# Patient Record
Sex: Female | Born: 1970 | Race: Asian | Hispanic: No | Marital: Married | Smoking: Former smoker
Health system: Southern US, Community
[De-identification: ages and names within clinical notes are randomized; demographics above are authoritative.]

## PROBLEM LIST (undated history)

## (undated) DIAGNOSIS — Z9989 Dependence on other enabling machines and devices: Secondary | ICD-10-CM

## (undated) DIAGNOSIS — G4733 Obstructive sleep apnea (adult) (pediatric): Secondary | ICD-10-CM

## (undated) DIAGNOSIS — F329 Major depressive disorder, single episode, unspecified: Secondary | ICD-10-CM

## (undated) DIAGNOSIS — D219 Benign neoplasm of connective and other soft tissue, unspecified: Secondary | ICD-10-CM

## (undated) DIAGNOSIS — Z8669 Personal history of other diseases of the nervous system and sense organs: Secondary | ICD-10-CM

## (undated) DIAGNOSIS — R350 Frequency of micturition: Secondary | ICD-10-CM

## (undated) DIAGNOSIS — E119 Type 2 diabetes mellitus without complications: Secondary | ICD-10-CM

## (undated) DIAGNOSIS — M1990 Unspecified osteoarthritis, unspecified site: Secondary | ICD-10-CM

## (undated) DIAGNOSIS — Z87898 Personal history of other specified conditions: Secondary | ICD-10-CM

## (undated) DIAGNOSIS — F32A Depression, unspecified: Secondary | ICD-10-CM

## (undated) DIAGNOSIS — Z8619 Personal history of other infectious and parasitic diseases: Secondary | ICD-10-CM

## (undated) DIAGNOSIS — I10 Essential (primary) hypertension: Secondary | ICD-10-CM

## (undated) DIAGNOSIS — Z973 Presence of spectacles and contact lenses: Secondary | ICD-10-CM

## (undated) DIAGNOSIS — F419 Anxiety disorder, unspecified: Secondary | ICD-10-CM

## (undated) DIAGNOSIS — E039 Hypothyroidism, unspecified: Secondary | ICD-10-CM

## (undated) HISTORY — DX: Personal history of other infectious and parasitic diseases: Z86.19

## (undated) HISTORY — DX: Major depressive disorder, single episode, unspecified: F32.9

## (undated) HISTORY — DX: Personal history of other specified conditions: Z87.898

## (undated) HISTORY — DX: Personal history of other diseases of the nervous system and sense organs: Z86.69

## (undated) HISTORY — DX: Dependence on other enabling machines and devices: Z99.89

## (undated) HISTORY — DX: Essential (primary) hypertension: I10

## (undated) HISTORY — DX: Benign neoplasm of connective and other soft tissue, unspecified: D21.9

## (undated) HISTORY — DX: Depression, unspecified: F32.A

## (undated) HISTORY — DX: Obstructive sleep apnea (adult) (pediatric): G47.33

---

## 1992-02-07 HISTORY — PX: LEEP: SHX91

## 2003-02-07 DIAGNOSIS — Z9289 Personal history of other medical treatment: Secondary | ICD-10-CM

## 2003-02-07 HISTORY — DX: Personal history of other medical treatment: Z92.89

## 2003-02-07 HISTORY — PX: DILATION AND CURETTAGE OF UTERUS: SHX78

## 2004-04-06 HISTORY — PX: MYOMECTOMY: SHX85

## 2008-05-12 ENCOUNTER — Emergency Department (HOSPITAL_BASED_OUTPATIENT_CLINIC_OR_DEPARTMENT_OTHER): Admission: EM | Admit: 2008-05-12 | Discharge: 2008-05-12 | Payer: Self-pay | Admitting: Emergency Medicine

## 2008-08-29 ENCOUNTER — Ambulatory Visit: Payer: Self-pay | Admitting: Diagnostic Radiology

## 2008-08-29 ENCOUNTER — Emergency Department (HOSPITAL_BASED_OUTPATIENT_CLINIC_OR_DEPARTMENT_OTHER): Admission: EM | Admit: 2008-08-29 | Discharge: 2008-08-29 | Payer: Self-pay | Admitting: Emergency Medicine

## 2008-09-24 HISTORY — PX: TRANSTHORACIC ECHOCARDIOGRAM: SHX275

## 2008-09-24 HISTORY — PX: OTHER SURGICAL HISTORY: SHX169

## 2010-05-15 LAB — URINALYSIS, ROUTINE W REFLEX MICROSCOPIC
Glucose, UA: 100 mg/dL — AB
Specific Gravity, Urine: 1.008 (ref 1.005–1.030)
Urobilinogen, UA: 0.2 mg/dL (ref 0.0–1.0)
pH: 6 (ref 5.0–8.0)

## 2010-05-15 LAB — BASIC METABOLIC PANEL
CO2: 23 mEq/L (ref 19–32)
Chloride: 100 mEq/L (ref 96–112)
GFR calc non Af Amer: >60 mL/min (ref >60)
Glucose, Bld: 193 mg/dL — ABNORMAL HIGH (ref 70–99)
Potassium: 3.7 mEq/L (ref 3.5–5.1)
Sodium: 138 mEq/L (ref 135–145)

## 2010-05-15 LAB — POCT CARDIAC MARKERS
Troponin i, poc: <0.05 ng/mL (ref 0.00–0.09)
Troponin i, poc: <0.05 ng/mL (ref 0.00–0.09)

## 2010-05-15 LAB — URINE MICROSCOPIC-ADD ON

## 2010-05-24 ENCOUNTER — Ambulatory Visit
Admission: RE | Admit: 2010-05-24 | Discharge: 2010-05-24 | Disposition: A | Discharge status: Home / Self Care | Payer: BLUE CROSS/BLUE SHIELD | Source: Ambulatory Visit | Attending: Sports Medicine | Admitting: Sports Medicine

## 2010-05-24 ENCOUNTER — Other Ambulatory Visit: Payer: Self-pay | Admitting: Sports Medicine

## 2010-05-24 DIAGNOSIS — S60222A Contusion of left hand, initial encounter: Secondary | ICD-10-CM

## 2010-07-09 IMAGING — CT CT HEAD W/O CM
1 series · 1 of 2 positions shown · non-contrast
Comparison: None

CLINICAL DATA: Posterior headache.  Hypertension and visual
problems.

CT HEAD WITHOUT CONTRAST
TECHNIQUE: Contiguous axial images were obtained from the base of
the skull through the vertex without contrast.

[Series 1: topogram 0.6 t20s · sagittal · 0.6mm · 1.00mm/px · 1 of 2 slices shown]
[im 2/2]
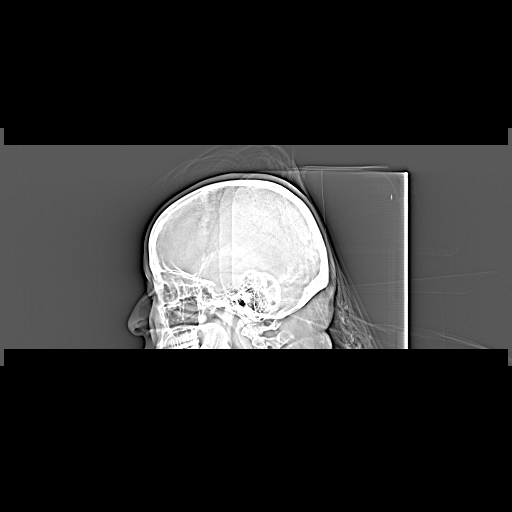

[1 of 2 positions shown; findings below may reference images not displayed]

FINDINGS: There is no evidence of acute intracranial hemorrhage,
mass lesion, brain edema or extra-axial fluid collection.  The
ventricles and subarachnoid spaces are appropriately sized for age.
There is no CT evidence of acute cortical infarction.

The visualized paranasal sinuses are clear.  The calvarium is
intact.
IMPRESSION: No acute intracranial findings.

## 2011-05-17 ENCOUNTER — Ambulatory Visit (INDEPENDENT_AMBULATORY_CARE_PROVIDER_SITE_OTHER): Payer: BC Managed Care – PPO | Admitting: Obstetrics and Gynecology

## 2011-05-17 ENCOUNTER — Encounter: Payer: Self-pay | Admitting: Obstetrics and Gynecology

## 2011-05-17 VITALS — BP 120/82 | Ht <= 58 in | Wt 163.0 lb

## 2011-05-17 DIAGNOSIS — Z124 Encounter for screening for malignant neoplasm of cervix: Secondary | ICD-10-CM

## 2011-05-17 DIAGNOSIS — D219 Benign neoplasm of connective and other soft tissue, unspecified: Secondary | ICD-10-CM | POA: Insufficient documentation

## 2011-05-17 DIAGNOSIS — D259 Leiomyoma of uterus, unspecified: Secondary | ICD-10-CM

## 2011-05-17 DIAGNOSIS — Z01419 Encounter for gynecological examination (general) (routine) without abnormal findings: Secondary | ICD-10-CM

## 2011-05-17 MED ORDER — VALACYCLOVIR HCL 500 MG PO TABS: 500.0000 mg | ORAL_TABLET | ORAL | Status: DC

## 2011-05-17 NOTE — Progress Notes (Signed)
Subjective:    Hannah Yang is a 41 y.o. female who presents for an annual exam. The patient reports:no complaints.S/P Robotic myomectomy and LEEP.   The patient is sexually active.no method Is OK with pregnancy.  Last pap: was normal  April  2012  GC/Chlamydia cultures offered: declined HIV/RPR/HbsAg offered:  declined HSV 1 and 2 glycoprotein offered: declined  Menstrual cycle:   LMP: Patient's last menstrual period was 05/02/2011.           Cycle is monthly with normal flow and without intermenstrual bleeding or severe dysmenorrhea  Menstrual History: OB History    Grav Para Term Preterm Abortions TAB SAB Ect Mult Living   0              Patient's last menstrual period was 05/02/2011.    The following portions of the patient's history were reviewed and updated as appropriate: allergies, current medications, past family history, past medical history, past social history, past surgical history and problem list.  Review of Systems Pertinent items are noted in HPI. Breast:Negative for breast lump,nipple discharge or nipple retraction Gastrointestinal: Negative for abdominal pain, change in bowel habits or rectal bleeding Urinary:negative    Objective:    BP 120/82  Ht 4\' 10"  (1.473 m)  Wt 163 lb (73.936 kg)  BMI 34.07 kg/m2  LMP 05/02/2011 Weight:  Wt Readings from Last 1 Encounters:  05/17/11 163 lb (73.936 kg)   BMI: Body mass index is 34.07 kg/(m^2). General Appearance: Alert, appropriate appearance for age. No acute distress HEENT: Grossly normal Neck / Thyroid: Supple, no masses, nodes or enlargement Lungs: clear to auscultation bilaterally Back: No CVA tenderness Breast Exam: No masses or nodes.No dimpling, nipple retraction or discharge. Cardiovascular: Regular rate and rhythm. S1, S2, no murmur Gastrointestinal: Soft, non-tender, no masses or organomegaly Pelvic Exam: Vulva and vagina appear normal. Bimanual exam reveals normal uterus and  adnexa. Rectovaginal: not indicated Lymphatic Exam: Non-palpable nodes in neck, clavicular, axillary, or inguinal regions Skin: no rash or abnormalities Neurologic: Normal gait and speech, no tremor  Psychiatric: Alert and oriented, appropriate affect.   Wet Prep:not applicable Urinalysis:not applicable UPT: Not done   Assessment:    Normal gyn exam    Plan:    pap smear return annually or prn Valtrex daily STD screening: done, declined Contraception:no method       Kaelin Holford A MD

## 2011-05-18 LAB — PAP IG W/ RFLX HPV ASCU

## 2012-04-02 IMAGING — CT CT 3D INDEPENDENT WKST
2 of 4 series · 5 of 14 positions shown, 6 images · non-contrast
Comparison: None

CLINICAL DATA: Left hand injury.

CT LEFT HAND WITHOUT CONTRAST
TECHNIQUE: Multidetector CT imaging of the left hand was performed
according to the standard protocol without intravenous contrast.
Multiplanar CT image reconstructions were also generated.
TECHNIQUE: 3-dimensional CT images were rendered by post-
processing of the original CT data at independent workstation.  The
3-dimensional CT images were interpreted, and findings were
reported in the accompanying complete CT report for this study.

[Series 3: left hand/wrist/bone · axial · 0.29mm/px · z∈[+105,+215]mm · 3 of 88 slices shown, 4 images]
[im 22/88  soft-tissue]
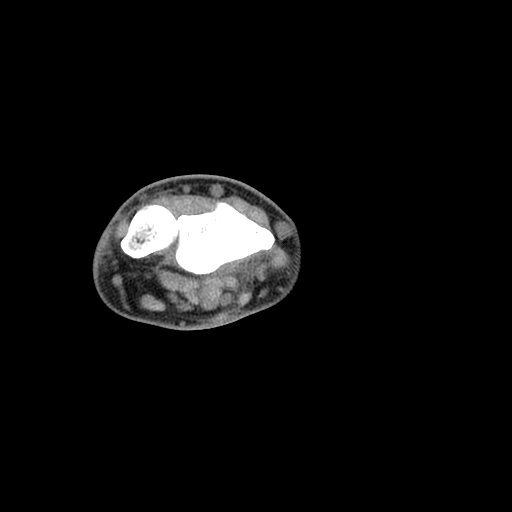
[im 22/88  bone]
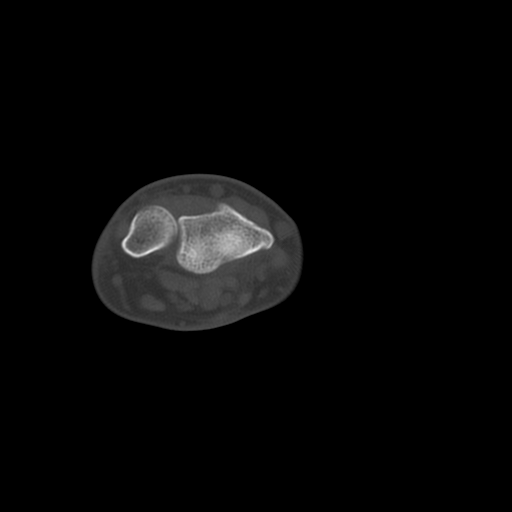
[im 44/88  bone]
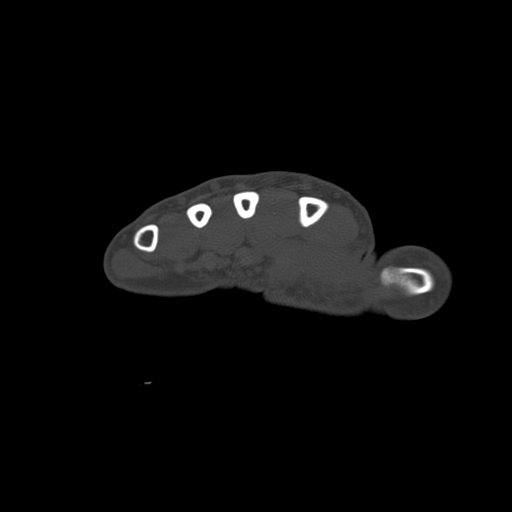
[im 66/88  bone]
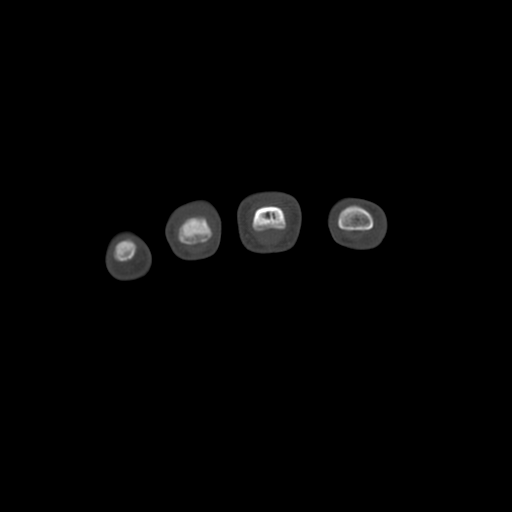

[Series 4: left hand/wrist/soft · axial · 0.29mm/px · z∈[+125,+198]mm · 2 of 88 slices shown]
[im 30/88  soft-tissue]
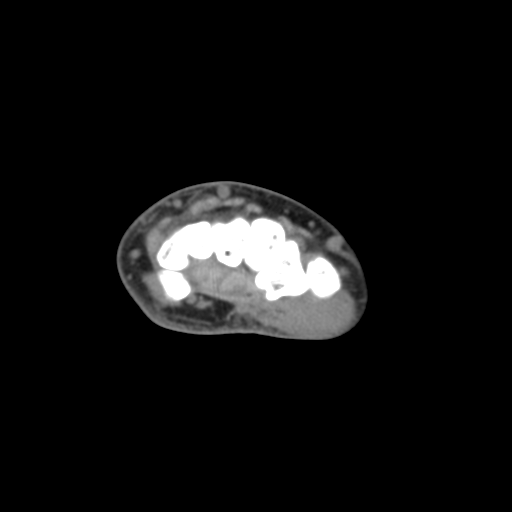
[im 59/88  soft-tissue]
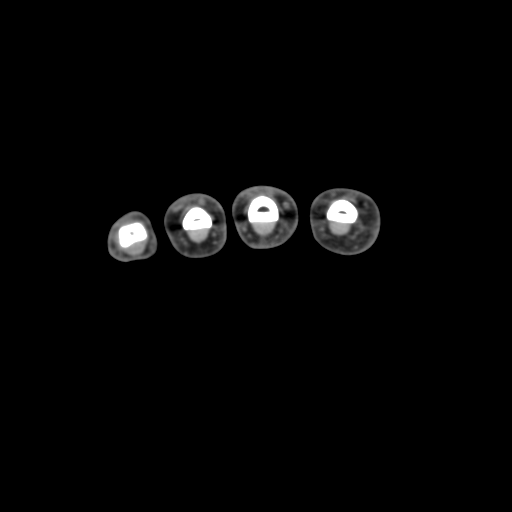

[5 of 14 positions shown; findings below may reference images not displayed]

FINDINGS: The joint spaces are maintained.  No fractures are
identified.  The radiocarpal, radioulnar and intercarpal joint
spaces are normal.  No erosions or fractures involving the wrist.
There is a rounded well corticated bony density along the radial
margin of the proximal phalanx of the middle finger which could be
an old avulsion fracture or a secondary unfused ossification
center.
IMPRESSION: No acute bony findings.

3-DIMENSIONAL CT IMAGE RENDERING AT INDEPENDENT WORKSTATION:

## 2012-07-08 ENCOUNTER — Other Ambulatory Visit: Payer: Self-pay | Admitting: *Deleted

## 2012-07-08 MED ORDER — ARMODAFINIL 150 MG PO TABS: 150.0000 mg | ORAL_TABLET | Freq: Every day | ORAL | Status: DC

## 2012-07-11 ENCOUNTER — Other Ambulatory Visit: Payer: Self-pay | Admitting: *Deleted

## 2012-07-11 MED ORDER — ARMODAFINIL 150 MG PO TABS: 150.0000 mg | ORAL_TABLET | Freq: Every day | ORAL | Status: DC

## 2012-07-17 ENCOUNTER — Telehealth: Payer: Self-pay | Admitting: *Deleted

## 2012-07-17 NOTE — Telephone Encounter (Signed)
FAXED NUVIGIL RX TO [facility].

## 2012-08-30 ENCOUNTER — Ambulatory Visit (INDEPENDENT_AMBULATORY_CARE_PROVIDER_SITE_OTHER): Payer: BC Managed Care – PPO | Admitting: Cardiovascular Disease

## 2012-08-30 ENCOUNTER — Encounter: Payer: Self-pay | Admitting: Cardiovascular Disease

## 2012-08-30 VITALS — BP 120/88 | HR 64 | Ht <= 58 in | Wt 157.1 lb

## 2012-08-30 DIAGNOSIS — R404 Transient alteration of awareness: Secondary | ICD-10-CM

## 2012-08-30 DIAGNOSIS — G4733 Obstructive sleep apnea (adult) (pediatric): Secondary | ICD-10-CM

## 2012-08-30 DIAGNOSIS — R400 Somnolence: Secondary | ICD-10-CM

## 2012-08-30 DIAGNOSIS — E669 Obesity, unspecified: Secondary | ICD-10-CM

## 2012-08-30 DIAGNOSIS — Z9989 Dependence on other enabling machines and devices: Secondary | ICD-10-CM

## 2012-08-30 DIAGNOSIS — I10 Essential (primary) hypertension: Secondary | ICD-10-CM

## 2012-08-30 NOTE — Patient Instructions (Addendum)
Your physician recommends that you schedule a follow-up appointment in: 1 year  

## 2012-08-31 ENCOUNTER — Encounter: Payer: Self-pay | Admitting: Cardiovascular Disease

## 2012-08-31 DIAGNOSIS — R400 Somnolence: Secondary | ICD-10-CM | POA: Insufficient documentation

## 2012-08-31 DIAGNOSIS — I10 Essential (primary) hypertension: Secondary | ICD-10-CM | POA: Insufficient documentation

## 2012-08-31 DIAGNOSIS — G4733 Obstructive sleep apnea (adult) (pediatric): Secondary | ICD-10-CM | POA: Insufficient documentation

## 2012-08-31 DIAGNOSIS — E669 Obesity, unspecified: Secondary | ICD-10-CM | POA: Insufficient documentation

## 2012-08-31 MED ORDER — ARMODAFINIL 150 MG PO TABS: 150.0000 mg | ORAL_TABLET | Freq: Every day | ORAL | Status: DC

## 2012-08-31 NOTE — Progress Notes (Signed)
Patient ID: Hannah Yang, female   DOB: [birth year], 42 y.o.   MRN: [id number]    HPI: Hannah Yang, is a 42 y.o. female who presents to the office to for  followup evaluation of her sleep apnea and her residual daytime sleepiness. I last saw in February 2013. She has recently been married and her name is now changed   from Hannah Yang.  Hannah Yang has a history of sleep apnea that was originally diagnosed in 2008 while she was living in [place] the that time, she did have moderate sleep apnea. Her weight has vacillated over the years. She has taking new visual for several years due to digital daytime sleepiness despite utilization of CPAP therapy for obstructive sleep apnea. When I saw her one year ago, I did make adjustments to her CPAP machine and reduced her EPR and increased her set pressure 210 she has noticed significant improvement in her sleep pattern appear she feels that this adjustment is made a difference. Feels that she is getting enough air through the night. He had lost more weight, but since her recent marriage she has again gained some weight back. She is in need for a refill of her Nuvigil prescription and presents for evaluation. She brought with her her CPAP machine but unfortunately did not bring with her the plugs so this was unable to be interrogated to assess her 30 day they appear, her history, she states she typically goes to bed approximately between 9:30 and 11 and wakes up typically around 7 AM. [employer] is [employer]. She believes she is averaging approximately 8 hours at night per use. She puts her below mask on when she goes to bed and takes it off in the morning and uses it 100% of the time. She admits that she is less sleepy than she was one year ago because he feels he is sleeping better with a pressure adjustment. One year ago, her fourth sleepiness score was 16 out of was calculated today as noted below has improved to a still suggestive of residual daytime sleepiness. Over  the past year, she has been able to be taken off her losartan as well as HCTZ with her weight loss and exercise. She states her blood pressure has still been well controlled and she only now takes Bystolic a reduced dose of 5 mg daily.    Epworth Sleepiness Scale: Situation   Chance of Dozing/Sleeping (0 = never , 1 = slight chance , 2 = moderate chance , 3 = high chance )   sitting and reading 1   watching TV 1   sitting inactive in a public place 1   being a passenger in a motor vehicle for an hour or more 3   lying down in the afternoon 3   sitting and talking to someone 1   sitting quietly after lunch (no alcohol) 1   while stopped for a few minutes in traffic as the driver 1   Total Score  12    Past Medical History  Diagnosis Date  . History of chicken pox   . High blood pressure   . History of measles, mumps, or rubella   . Fibroid   . H/O sleep apnea   . Diabetes mellitus   . Depression     Past Surgical History  Procedure Laterality Date  . Dilation and curettage of uterus  2005  . Leep  1994    Allergies  Allergen Reactions  . Shellfish Allergy  Current Outpatient Prescriptions  Medication Sig Dispense Refill  . Armodafinil (NUVIGIL) 150 MG tablet Take 1 tablet (150 mg total) by mouth daily. prn  90 tablet  3  . cetirizine (ZYRTEC) 10 MG tablet Take 10 mg by mouth daily.      Marland Kitchen Fexofenadine HCl (ALLEGRA PO) Take by mouth daily.      . metformin (FORTAMET) 1000 MG (OSM) 24 hr tablet Take 750 mg by mouth Nightly.       . mometasone (NASONEX) 50 MCG/ACT nasal spray Place 2 sprays into the nose daily.      . nebivolol (BYSTOLIC) 10 MG tablet Take 10 mg by mouth every morning.      Marland Kitchen Scopolamine Hydrobromide 0.4 MG TABS Take by mouth as needed (motion sickness - boats).      . valACYclovir (VALTREX) 500 MG tablet Take 1 tablet (500 mg total) by mouth 1 day or 1 dose.  90 tablet  4   No current facility-administered medications for this visit.   Socially she  is adopted. She was born in [place]. She works as a Best boy for [employer]. She's recently been married. She does exercise. No tobacco history.   ROS is negative for fever chills or night sweats she denies chest pain. She denies presyncope or syncope. She denies tachycardia palpitations. There is no wheezing. Her weight has fluctuated. She had lost more weight but since her marriage has gained some back. There is no chest pain. She denies abdominal pain. She denies change in bowel or bladder habits. She admits to sleeping through the night most nights. She denies any breakthrough snoring. She denies any hypnagogic hallucinations. She denies any cataplectic spells. There is no bruxism. She denies restless legs. There is no edema. Other system review is negative.  PE BP 120/88  Pulse 64  Ht 4\' 10"  (1.473 m)  Wt 157 lb 1.6 oz (71.26 kg)  BMI 32.84 kg/m2  General: Alert, oriented, no distress.  Skin: normal turgor, no rashes HEENT: Normocephalic, atraumatic. Pupils round and reactive; sclera anicteric; Nose without nasal septal hypertrophy Mouth/Parynx benign; Mallinpatti scale 3 Neck: No JVD, no carotid briuts Lungs: clear to ausculatation and percussion; no wheezing or rales Heart: RRR, s1 s2 normal no S3 or S4 gallop Abdomen: soft, nontender; no hepatosplenomehaly, BS+; abdominal aorta nontender and not dilated by palpation. Pulses 2+ Extremities: no clubbinbg cyanosis or edema, Homan's sign negative  Neurologic: grossly nonfocal  ECG: Normal sinus rhythm at 64 beats per minute period. We'll 128 ms. Nondiagnostic T changes V1 through V3.  LABS:  BMET    Component Value Date/Time   NA 138 08/29/2008 0112   K 3.7 08/29/2008 0112   CL 100 08/29/2008 0112   CO2 23 08/29/2008 0112   GLUCOSE 193* 08/29/2008 0112   BUN 10 08/29/2008 0112   CREATININE .6 08/29/2008 0112   CALCIUM 9.3 08/29/2008 0112   GFRNONAA >60 08/29/2008 0112   GFRAA  Value: >60        The eGFR has been  calculated using the MDRD equation. This calculation has not been validated in all clinical situations. eGFR's persistently <60 mL/min signify possible Chronic Kidney Disease. 08/29/2008 0112     Hepatic Function Panel  No results found for this basename: prot, albumin, ast, alt, alkphos, bilitot, bilidir, ibili     CBC No results found for this basename: wbc, rbc, hgb, hct, plt, mcv, mch, mchc, rdw, neutrabs, lymphsabs, monoabs, eosabs, basosabs     BNP No results  found for this basename: probnp    Lipid Panel  No results found for this basename: chol, trig, hdl, cholhdl, vldl, ldlcalc     RADIOLOGY: No results found.    ASSESSMENT AND PLAN: Ms. Borden is now 42 years old she has documented moderate sleep apnea by her diagnostic polysomnogram done in [place].  She is felt that her sleep pattern is significantly improved with her CPAP adjustment done one year ago. I will try to obtain a recent download from her [employer] to make certain further adjustments are not necessary to she is less sleepy this year compared to last year but still at time may need to take an occasional nap in the afternoon circumstances permit the eye renewing her prescription for Nuvigil 150 mg per her blood pressure is continues to be well-controlled and now she's only taken Bystolic a reduced dose of 5 mg and no longer takes her losartan and HCTZ. There is no evidence for edema. She does have nondiagnostic T-wave abnormalities on her EKG. She does not have chest pain. She does not have PND or orthopnea. She denies any nocturnal arrhythmias. Once we receive the download from [facility] adjustments will need to be made to he CPAP therapy we will contact her. Otherwise I will see her in one year for followup evaluation.     Lennette Bihari, MD, [facility]  08/31/2012 7:07 PM

## 2012-09-26 ENCOUNTER — Other Ambulatory Visit: Payer: Self-pay | Admitting: Obstetrics and Gynecology

## 2012-09-26 DIAGNOSIS — Z1231 Encounter for screening mammogram for malignant neoplasm of breast: Secondary | ICD-10-CM

## 2012-10-15 ENCOUNTER — Ambulatory Visit: Payer: BC Managed Care – PPO

## 2012-10-21 ENCOUNTER — Ambulatory Visit
Admission: RE | Admit: 2012-10-21 | Discharge: 2012-10-21 | Disposition: A | Discharge status: Home / Self Care | Payer: BC Managed Care – PPO | Source: Ambulatory Visit | Attending: Obstetrics and Gynecology | Admitting: Obstetrics and Gynecology

## 2012-10-21 DIAGNOSIS — Z1231 Encounter for screening mammogram for malignant neoplasm of breast: Secondary | ICD-10-CM

## 2012-11-13 ENCOUNTER — Other Ambulatory Visit: Payer: Self-pay | Admitting: *Deleted

## 2012-11-13 MED ORDER — ARMODAFINIL 150 MG PO TABS: 150.0000 mg | ORAL_TABLET | Freq: Every day | ORAL | Status: DC

## 2012-11-13 NOTE — Telephone Encounter (Signed)
Refill for Nuvigil 150mg  [facility] pr[facility] faxed. #90 with 3 refills, authorized on 11/13/12 by Dr. Nicki Guadalajara

## 2012-11-15 ENCOUNTER — Telehealth: Payer: Self-pay | Admitting: *Deleted

## 2012-11-15 ENCOUNTER — Other Ambulatory Visit: Payer: Self-pay | Admitting: *Deleted

## 2012-11-15 MED ORDER — ARMODAFINIL 150 MG PO TABS: 150.0000 mg | ORAL_TABLET | Freq: Every day | ORAL | Status: DC

## 2012-11-15 NOTE — Telephone Encounter (Signed)
Refilled patient's Nuvigil 150 mg # 90 with 1 additional refill.

## 2013-07-31 ENCOUNTER — Other Ambulatory Visit: Payer: Self-pay | Admitting: Family Medicine

## 2013-07-31 DIAGNOSIS — R2231 Localized swelling, mass and lump, right upper limb: Secondary | ICD-10-CM

## 2013-08-04 ENCOUNTER — Ambulatory Visit
Admission: RE | Admit: 2013-08-04 | Discharge: 2013-08-04 | Disposition: A | Discharge status: Home / Self Care | Payer: BC Managed Care – PPO | Source: Ambulatory Visit | Attending: Family Medicine | Admitting: Family Medicine

## 2013-08-04 ENCOUNTER — Other Ambulatory Visit: Payer: Self-pay | Admitting: Family Medicine

## 2013-08-04 DIAGNOSIS — R2231 Localized swelling, mass and lump, right upper limb: Secondary | ICD-10-CM

## 2013-11-27 ENCOUNTER — Other Ambulatory Visit: Payer: Self-pay

## 2013-11-27 DIAGNOSIS — Z1231 Encounter for screening mammogram for malignant neoplasm of breast: Secondary | ICD-10-CM

## 2013-11-28 ENCOUNTER — Ambulatory Visit
Admission: RE | Admit: 2013-11-28 | Discharge: 2013-11-28 | Disposition: A | Discharge status: Home / Self Care | Payer: BC Managed Care – PPO | Source: Ambulatory Visit

## 2013-11-28 DIAGNOSIS — Z1231 Encounter for screening mammogram for malignant neoplasm of breast: Secondary | ICD-10-CM

## 2013-12-31 ENCOUNTER — Encounter: Payer: Self-pay | Admitting: *Deleted

## 2014-01-06 ENCOUNTER — Ambulatory Visit: Payer: BC Managed Care – PPO | Admitting: Cardiovascular Disease

## 2016-04-25 DIAGNOSIS — E7800 Pure hypercholesterolemia, unspecified: Secondary | ICD-10-CM | POA: Diagnosis not present

## 2016-04-25 DIAGNOSIS — E1165 Type 2 diabetes mellitus with hyperglycemia: Secondary | ICD-10-CM | POA: Diagnosis not present

## 2016-04-25 DIAGNOSIS — E02 Subclinical iodine-deficiency hypothyroidism: Secondary | ICD-10-CM | POA: Diagnosis not present

## 2016-05-01 DIAGNOSIS — E7800 Pure hypercholesterolemia, unspecified: Secondary | ICD-10-CM | POA: Diagnosis not present

## 2016-05-01 DIAGNOSIS — E1165 Type 2 diabetes mellitus with hyperglycemia: Secondary | ICD-10-CM | POA: Diagnosis not present

## 2016-05-01 DIAGNOSIS — I10 Essential (primary) hypertension: Secondary | ICD-10-CM | POA: Diagnosis not present

## 2016-05-12 DIAGNOSIS — G4733 Obstructive sleep apnea (adult) (pediatric): Secondary | ICD-10-CM | POA: Diagnosis not present

## 2016-06-12 DIAGNOSIS — G4733 Obstructive sleep apnea (adult) (pediatric): Secondary | ICD-10-CM | POA: Diagnosis not present

## 2016-06-20 DIAGNOSIS — G4733 Obstructive sleep apnea (adult) (pediatric): Secondary | ICD-10-CM | POA: Diagnosis not present

## 2016-07-21 DIAGNOSIS — G4733 Obstructive sleep apnea (adult) (pediatric): Secondary | ICD-10-CM | POA: Diagnosis not present

## 2016-08-11 DIAGNOSIS — G4733 Obstructive sleep apnea (adult) (pediatric): Secondary | ICD-10-CM | POA: Diagnosis not present

## 2016-08-20 DIAGNOSIS — G4733 Obstructive sleep apnea (adult) (pediatric): Secondary | ICD-10-CM | POA: Diagnosis not present

## 2016-09-20 DIAGNOSIS — G4733 Obstructive sleep apnea (adult) (pediatric): Secondary | ICD-10-CM | POA: Diagnosis not present

## 2016-10-21 DIAGNOSIS — G4733 Obstructive sleep apnea (adult) (pediatric): Secondary | ICD-10-CM | POA: Diagnosis not present

## 2016-10-23 ENCOUNTER — Ambulatory Visit: Payer: 59 | Admitting: Clinical

## 2016-10-25 DIAGNOSIS — E559 Vitamin D deficiency, unspecified: Secondary | ICD-10-CM | POA: Diagnosis not present

## 2016-10-25 DIAGNOSIS — E039 Hypothyroidism, unspecified: Secondary | ICD-10-CM | POA: Diagnosis not present

## 2016-10-25 DIAGNOSIS — E7800 Pure hypercholesterolemia, unspecified: Secondary | ICD-10-CM | POA: Diagnosis not present

## 2016-10-25 DIAGNOSIS — E1165 Type 2 diabetes mellitus with hyperglycemia: Secondary | ICD-10-CM | POA: Diagnosis not present

## 2016-11-01 DIAGNOSIS — E7800 Pure hypercholesterolemia, unspecified: Secondary | ICD-10-CM | POA: Diagnosis not present

## 2016-11-01 DIAGNOSIS — I10 Essential (primary) hypertension: Secondary | ICD-10-CM | POA: Diagnosis not present

## 2016-11-01 DIAGNOSIS — E1165 Type 2 diabetes mellitus with hyperglycemia: Secondary | ICD-10-CM | POA: Diagnosis not present

## 2016-11-02 DIAGNOSIS — M25511 Pain in right shoulder: Secondary | ICD-10-CM | POA: Diagnosis not present

## 2016-11-02 DIAGNOSIS — M25572 Pain in left ankle and joints of left foot: Secondary | ICD-10-CM | POA: Diagnosis not present

## 2016-11-08 DIAGNOSIS — M25511 Pain in right shoulder: Secondary | ICD-10-CM | POA: Diagnosis not present

## 2016-11-10 DIAGNOSIS — M25511 Pain in right shoulder: Secondary | ICD-10-CM | POA: Diagnosis not present

## 2016-11-13 DIAGNOSIS — G4733 Obstructive sleep apnea (adult) (pediatric): Secondary | ICD-10-CM | POA: Diagnosis not present

## 2016-11-15 DIAGNOSIS — Z1231 Encounter for screening mammogram for malignant neoplasm of breast: Secondary | ICD-10-CM | POA: Diagnosis not present

## 2016-11-15 DIAGNOSIS — M25511 Pain in right shoulder: Secondary | ICD-10-CM | POA: Diagnosis not present

## 2016-11-15 DIAGNOSIS — S46011D Strain of muscle(s) and tendon(s) of the rotator cuff of right shoulder, subsequent encounter: Secondary | ICD-10-CM | POA: Diagnosis not present

## 2016-11-15 DIAGNOSIS — Z01419 Encounter for gynecological examination (general) (routine) without abnormal findings: Secondary | ICD-10-CM | POA: Diagnosis not present

## 2016-11-15 DIAGNOSIS — M25611 Stiffness of right shoulder, not elsewhere classified: Secondary | ICD-10-CM | POA: Diagnosis not present

## 2016-11-20 DIAGNOSIS — G4733 Obstructive sleep apnea (adult) (pediatric): Secondary | ICD-10-CM | POA: Diagnosis not present

## 2016-11-23 DIAGNOSIS — G4733 Obstructive sleep apnea (adult) (pediatric): Secondary | ICD-10-CM | POA: Diagnosis not present

## 2016-11-28 DIAGNOSIS — D2239 Melanocytic nevi of other parts of face: Secondary | ICD-10-CM | POA: Diagnosis not present

## 2016-11-28 DIAGNOSIS — D2262 Melanocytic nevi of left upper limb, including shoulder: Secondary | ICD-10-CM | POA: Diagnosis not present

## 2016-11-28 DIAGNOSIS — D485 Neoplasm of uncertain behavior of skin: Secondary | ICD-10-CM | POA: Diagnosis not present

## 2016-11-28 DIAGNOSIS — D225 Melanocytic nevi of trunk: Secondary | ICD-10-CM | POA: Diagnosis not present

## 2016-12-21 DIAGNOSIS — G4733 Obstructive sleep apnea (adult) (pediatric): Secondary | ICD-10-CM | POA: Diagnosis not present

## 2017-01-10 DIAGNOSIS — J0100 Acute maxillary sinusitis, unspecified: Secondary | ICD-10-CM | POA: Diagnosis not present

## 2017-01-10 DIAGNOSIS — H938X3 Other specified disorders of ear, bilateral: Secondary | ICD-10-CM | POA: Diagnosis not present

## 2017-01-10 DIAGNOSIS — J0110 Acute frontal sinusitis, unspecified: Secondary | ICD-10-CM | POA: Diagnosis not present

## 2017-01-20 DIAGNOSIS — G4733 Obstructive sleep apnea (adult) (pediatric): Secondary | ICD-10-CM | POA: Diagnosis not present

## 2017-02-20 DIAGNOSIS — G4733 Obstructive sleep apnea (adult) (pediatric): Secondary | ICD-10-CM | POA: Diagnosis not present

## 2017-03-03 DIAGNOSIS — Z719 Counseling, unspecified: Secondary | ICD-10-CM | POA: Diagnosis not present

## 2017-03-15 DIAGNOSIS — Z719 Counseling, unspecified: Secondary | ICD-10-CM | POA: Diagnosis not present

## 2017-03-22 DIAGNOSIS — Z719 Counseling, unspecified: Secondary | ICD-10-CM | POA: Diagnosis not present

## 2017-03-23 DIAGNOSIS — G4733 Obstructive sleep apnea (adult) (pediatric): Secondary | ICD-10-CM | POA: Diagnosis not present

## 2017-03-29 DIAGNOSIS — Z719 Counseling, unspecified: Secondary | ICD-10-CM | POA: Diagnosis not present

## 2017-04-05 DIAGNOSIS — Z719 Counseling, unspecified: Secondary | ICD-10-CM | POA: Diagnosis not present

## 2017-05-22 DIAGNOSIS — E1165 Type 2 diabetes mellitus with hyperglycemia: Secondary | ICD-10-CM | POA: Diagnosis not present

## 2017-05-22 DIAGNOSIS — E559 Vitamin D deficiency, unspecified: Secondary | ICD-10-CM | POA: Diagnosis not present

## 2017-05-22 DIAGNOSIS — E039 Hypothyroidism, unspecified: Secondary | ICD-10-CM | POA: Diagnosis not present

## 2017-05-22 DIAGNOSIS — E7800 Pure hypercholesterolemia, unspecified: Secondary | ICD-10-CM | POA: Diagnosis not present

## 2017-05-28 DIAGNOSIS — I10 Essential (primary) hypertension: Secondary | ICD-10-CM | POA: Diagnosis not present

## 2017-05-28 DIAGNOSIS — E1165 Type 2 diabetes mellitus with hyperglycemia: Secondary | ICD-10-CM | POA: Diagnosis not present

## 2017-05-28 DIAGNOSIS — E7800 Pure hypercholesterolemia, unspecified: Secondary | ICD-10-CM | POA: Diagnosis not present

## 2017-11-15 DIAGNOSIS — G4733 Obstructive sleep apnea (adult) (pediatric): Secondary | ICD-10-CM | POA: Diagnosis not present

## 2017-11-26 DIAGNOSIS — Z3041 Encounter for surveillance of contraceptive pills: Secondary | ICD-10-CM | POA: Diagnosis not present

## 2017-11-26 DIAGNOSIS — Z1231 Encounter for screening mammogram for malignant neoplasm of breast: Secondary | ICD-10-CM | POA: Diagnosis not present

## 2017-11-26 DIAGNOSIS — N920 Excessive and frequent menstruation with regular cycle: Secondary | ICD-10-CM | POA: Diagnosis not present

## 2017-11-26 DIAGNOSIS — Z01419 Encounter for gynecological examination (general) (routine) without abnormal findings: Secondary | ICD-10-CM | POA: Diagnosis not present

## 2017-11-26 DIAGNOSIS — Z6832 Body mass index (BMI) 32.0-32.9, adult: Secondary | ICD-10-CM | POA: Diagnosis not present

## 2017-12-17 DIAGNOSIS — E1165 Type 2 diabetes mellitus with hyperglycemia: Secondary | ICD-10-CM | POA: Diagnosis not present

## 2017-12-17 DIAGNOSIS — E559 Vitamin D deficiency, unspecified: Secondary | ICD-10-CM | POA: Diagnosis not present

## 2017-12-17 DIAGNOSIS — E7800 Pure hypercholesterolemia, unspecified: Secondary | ICD-10-CM | POA: Diagnosis not present

## 2017-12-24 DIAGNOSIS — E1165 Type 2 diabetes mellitus with hyperglycemia: Secondary | ICD-10-CM | POA: Diagnosis not present

## 2017-12-24 DIAGNOSIS — I10 Essential (primary) hypertension: Secondary | ICD-10-CM | POA: Diagnosis not present

## 2017-12-24 DIAGNOSIS — E7800 Pure hypercholesterolemia, unspecified: Secondary | ICD-10-CM | POA: Diagnosis not present

## 2017-12-31 DIAGNOSIS — D224 Melanocytic nevi of scalp and neck: Secondary | ICD-10-CM | POA: Diagnosis not present

## 2017-12-31 DIAGNOSIS — D2262 Melanocytic nevi of left upper limb, including shoulder: Secondary | ICD-10-CM | POA: Diagnosis not present

## 2017-12-31 DIAGNOSIS — D2239 Melanocytic nevi of other parts of face: Secondary | ICD-10-CM | POA: Diagnosis not present

## 2019-05-17 ENCOUNTER — Ambulatory Visit: Payer: Self-pay | Attending: Internal Medicine

## 2019-05-17 DIAGNOSIS — Z23 Encounter for immunization: Secondary | ICD-10-CM

## 2019-05-17 NOTE — Progress Notes (Signed)
   Covid-19 Vaccination Clinic  Name:  Elissa Grieshop    MRN: [id number] DOB: [birth year]  05/17/2019  Ms. Ernandez was observed post Covid-19 immunization for 15 minutes without incident. She was provided with Vaccine Information Sheet and instruction to access the V-Safe system.   Ms. Skalsky was instructed to call 911 with any severe reactions post vaccine: Marland Kitchen Difficulty breathing  . Swelling of face and throat  . A fast heartbeat  . A bad rash all over body  . Dizziness and weakness   Immunizations Administered    Name Date Dose VIS Date Route   Pfizer COVID-19 Vaccine 05/17/2019  4:42 PM 0.3 mL 01/17/2019 Intramuscular   Manufacturer: [place]   Lot: [id number]   [place]: [id number]

## 2019-05-19 ENCOUNTER — Other Ambulatory Visit: Payer: Self-pay

## 2019-06-09 ENCOUNTER — Ambulatory Visit: Payer: Self-pay | Attending: Internal Medicine

## 2019-06-09 DIAGNOSIS — Z23 Encounter for immunization: Secondary | ICD-10-CM

## 2019-06-09 NOTE — Progress Notes (Signed)
2  Covid-19 Vaccination Clinic  Name:  Hannah Yang    MRN: [id number] DOB: [birth year]  06/09/2019  [name] was observed post Covid-19 immunization for 15 minutes without incident. She was provided with Vaccine Information Sheet and instruction to access the V-Safe system.   [name] was instructed to call 911 with any severe reactions post vaccine: Marland Kitchen Difficulty breathing  . Swelling of face and throat  . A fast heartbeat  . A bad rash all over body  . Dizziness and weakness   Immunizations Administered    Name Date Dose VIS Date Route   Pfizer COVID-19 Vaccine 06/09/2019 12:59 PM 0.3 mL 04/02/2018 Intramuscular   Manufacturer: [place]   Lot: [id number]   [place]: [id number]

## 2020-05-28 ENCOUNTER — Other Ambulatory Visit: Payer: Self-pay | Admitting: Obstetrics and Gynecology

## 2020-06-09 ENCOUNTER — Other Ambulatory Visit: Payer: Self-pay | Admitting: Obstetrics and Gynecology

## 2020-07-07 ENCOUNTER — Other Ambulatory Visit: Payer: Self-pay

## 2020-07-07 ENCOUNTER — Encounter (HOSPITAL_BASED_OUTPATIENT_CLINIC_OR_DEPARTMENT_OTHER): Payer: Self-pay | Admitting: Obstetrics and Gynecology

## 2020-07-07 DIAGNOSIS — Z01818 Encounter for other preprocedural examination: Secondary | ICD-10-CM | POA: Diagnosis not present

## 2020-07-07 DIAGNOSIS — Z20822 Contact with and (suspected) exposure to covid-19: Secondary | ICD-10-CM | POA: Diagnosis not present

## 2020-07-07 NOTE — Progress Notes (Addendum)
Spoke w/ via phone for pre-op interview---PT Lab needs dos---- URINE PREG              Lab results------LAB APPT 07-13-2020 1000 AM FOR CBC BMP T & S EKG COVID test -----07-13-2020 900  AM (EXTENDED RECOVERY PT) NPO after MN NO Solid Food.  DRINK 2 PRESURGERY GATORADE DRINKS AT 1000 PM NIGHT BEFORE SURGERY, DRINK 1 PRESURGERY GATORADE DRINK AT 430 AM MORNING OF SURGERY Clear liquids from MN until---430 AM THEN NPO Med rec completed Medications to take morning of surgery -----LEVOTHYROXINE, LEXAPRO Diabetic medication -----NONE DAY OF SURGERY Patient instructed no nail polish to be worn day of surgery Patient instructed to bring photo id and insurance card day of surgery Patient aware to have Driver (ride ) / caregiver  SPOUSE Hannah Yang WILL STAY   for 24 hours after surgery  Patient Special Instructions -----BRING CPAP MASK TUBING AND MACHINE AND LEAVE IN ACR Pre-Op special Istructions -----NONE Patient verbalized understanding of instructions that were given at this phone interview. Patient denies shortness of breath, chest pain, fever, cough at this phone interview.  ECHO 09-24-2008 Epic LOV DR Claiborne Billings [phone], SAW DR KELLY FOR OSA/CARDIOLOGY, NO CURRENT CARDIOLOGIST

## 2020-07-07 NOTE — Progress Notes (Signed)
YOU ARE SCHEDULED FOR A COVID TEST  07-13-2020 AT 900 AM. THIS TEST MUST BE DONE BEFORE SURGERY. GO TO  [facility] [place], [place], IT IS APPROXIMATELY 2 MINUTES PAST [employer] ON THE RIGHT AND REMAIN IN YOUR CAR, THIS IS A DRIVE UP TEST.       Your procedure is scheduled on 07-15-2020  Report to [facility]   Call this number if you have problems the morning of surgery  :[phone].   OUR ADDRESS IS [address].  WE ARE LOCATED [facility].  PLEASE BRING YOUR INSURANCE CARD AND PHOTO ID DAY OF SURGERY.  ONLY ONE PERSON ALLOWED IN FACILITY WAITING AREA.                                     REMEMBER:  DO NOT EAT FOOD, CANDY GUM OR MINTS  AFTER MIDNIGHT . DRINK 2 PRESURGERY GATORADE DRINKS AT 1000 PM MIGHT BEFORE SURGERY.  DRINK 1 PRESURGERY GATORADE DRINK AT 430 AM MORNING OF SURGERY.YOU MAY HAVE CLEAR LIQUIDS FROM MIDNIGHT UNTIL 430 AM. NO CLEAR LIQUIDS AFTER  430 AM DAY OF SURGERY.   YOU MAY  BRUSH YOUR TEETH MORNING OF SURGERY AND RINSE YOUR MOUTH OUT, NO CHEWING GUM CANDY OR MINTS.    CLEAR LIQUID DIET   Foods Allowed                                                                     Foods Excluded  Coffee and tea, regular and decaf                             liquids that you cannot  Plain Jell-O any favor except red or purple                                           see through such as: Fruit ices (not with fruit pulp)                                     milk, soups, orange juice  Iced Popsicles                                    All solid food Carbonated beverages, regular and diet                                    Cranberry, grape and apple juices Sports drinks like Gatorade Lightly seasoned clear broth or consume(fat free) Sugar, honey syrup  Sample Menu Breakfast  Lunch                                     Supper Cranberry juice                    Beef broth                             Chicken broth Jell-O                                     Grape juice                           Apple juice Coffee or tea                        Jell-O                                      Popsicle                                                Coffee or tea                        Coffee or tea  _____________________________________________________________________     TAKE THESE MEDICATIONS MORNING OF SURGERY WITH A SIP OF WATER:  LEVOTHYROXINE, LEXAPRO.  NO DIABETIC MEDICATIONS ARE TO BE TAKEN MORNING OF SURGERY.  BRING CPAP MASK, TUBING AND MACHINE AND LEAVE IN CAR.  ONE VISITOR IS ALLOWED IN WAITING ROOM ONLY DAY OF SURGERY.  NO VISITOR MAY SPEND THE NIGHT.  VISITOR ARE ALLOWED TO STAY UNTIL 800 PM.                                    DO NOT WEAR JEWERLY, MAKE UP. DO NOT WEAR LOTIONS, POWDERS, PERFUMES OR NAIL POLISH. DO NOT SHAVE FOR 24 HOURS PRIOR TO DAY OF SURGERY. MEN MAY SHAVE FACE AND NECK. CONTACTS, GLASSES, OR DENTURES MAY NOT BE WORN TO SURGERY.                                    [facility] IS NOT RESPONSIBLE  FOR ANY BELONGINGS.                                                                    Marland Kitchen           [facility] - Preparing for Surgery Before surgery, you can play an important role.  Because skin is not sterile, your skin needs to be as free of germs as possible.  You can reduce the number of germs on your skin by washing with CHG (chlorahexidine gluconate) soap before surgery.  CHG is an antiseptic cleaner which kills germs and bonds with the skin to continue killing germs even after washing. Please DO NOT use if you have an allergy to CHG or antibacterial soaps.  If your skin becomes reddened/irritated stop using the CHG and inform your nurse when you arrive at Short Stay. Do not shave (including legs and underarms) for at least 48 hours prior to the first CHG shower.  You may shave your face/neck. Please follow these instructions carefully:  1.   Shower with CHG Soap the night before surgery and the  morning of Surgery.  2.  If you choose to wash your hair, wash your hair first as usual with your  normal  shampoo.  3.  After you shampoo, rinse your hair and body thoroughly to remove the  shampoo.                            4.  Use CHG as you would any other liquid soap.  You can apply chg directly  to the skin and wash                      Gently with a scrungie or clean washcloth.  5.  Apply the CHG Soap to your body ONLY FROM THE NECK DOWN.   Do not use on face/ open                           Wound or open sores. Avoid contact with eyes, ears mouth and genitals (private parts).                       Wash face,  Genitals (private parts) with your normal soap.             6.  Wash thoroughly, paying special attention to the area where your surgery  will be performed.  7.  Thoroughly rinse your body with warm water from the neck down.  8.  DO NOT shower/wash with your normal soap after using and rinsing off  the CHG Soap.                9.  Pat yourself dry with a clean towel.            10.  Wear clean pajamas.            11.  Place clean sheets on your bed the night of your first shower and do not  sleep with pets. Day of Surgery : Do not apply any lotions/deodorants the morning of surgery.  Please wear clean clothes to the hospital/surgery center.  FAILURE TO FOLLOW THESE INSTRUCTIONS MAY RESULT IN THE CANCELLATION OF YOUR SURGERY PATIENT SIGNATURE_________________________________  NURSE SIGNATURE__________________________________  ________________________________________________________________________                                                        QUESTIONS Hannah Yang PRE OP NURSE PHONE [phone].

## 2020-07-13 ENCOUNTER — Other Ambulatory Visit (HOSPITAL_COMMUNITY)
Admission: RE | Admit: 2020-07-13 | Discharge: 2020-07-13 | Disposition: A | Discharge status: Home / Self Care | Payer: 59 | Source: Ambulatory Visit | Attending: Obstetrics and Gynecology | Admitting: Obstetrics and Gynecology

## 2020-07-13 ENCOUNTER — Other Ambulatory Visit: Payer: Self-pay

## 2020-07-13 ENCOUNTER — Encounter (HOSPITAL_COMMUNITY)
Admission: RE | Admit: 2020-07-13 | Discharge: 2020-07-13 | Disposition: A | Discharge status: Home / Self Care | Payer: 59 | Source: Ambulatory Visit | Attending: Obstetrics and Gynecology | Admitting: Obstetrics and Gynecology

## 2020-07-13 DIAGNOSIS — Z01818 Encounter for other preprocedural examination: Secondary | ICD-10-CM | POA: Diagnosis not present

## 2020-07-13 DIAGNOSIS — Z20822 Contact with and (suspected) exposure to covid-19: Secondary | ICD-10-CM | POA: Insufficient documentation

## 2020-07-13 LAB — BASIC METABOLIC PANEL
Anion gap: 7 (ref 5–15)
BUN: 15 mg/dL (ref 6–20)
CO2: 25 mmol/L (ref 22–32)
Calcium: 9.4 mg/dL (ref 8.9–10.3)
Chloride: 106 mmol/L (ref 98–111)
Creatinine, Ser: 0.69 mg/dL (ref 0.44–1.00)
GFR, Estimated: >60 mL/min (ref >60)
Glucose, Bld: 171 mg/dL — ABNORMAL HIGH (ref 70–99)
Potassium: 4.2 mmol/L (ref 3.5–5.1)
Sodium: 138 mmol/L (ref 135–145)

## 2020-07-13 LAB — CBC
HCT: 44.8 % (ref 36.0–46.0)
Hemoglobin: 13.4 g/dL (ref 12.0–15.0)
MCH: 21.4 pg — ABNORMAL LOW (ref 26.0–34.0)
MCHC: 29.9 g/dL — ABNORMAL LOW (ref 30.0–36.0)
MCV: 71.6 fL — ABNORMAL LOW (ref 80.0–100.0)
Platelets: 283 10*3/uL (ref 150–400)
RBC: 6.26 MIL/uL — ABNORMAL HIGH (ref 3.87–5.11)
RDW: 23.7 % — ABNORMAL HIGH (ref 11.5–15.5)
WBC: 6.7 10*3/uL (ref 4.0–10.5)
nRBC: 0 % (ref 0.0–0.2)

## 2020-07-13 LAB — SARS CORONAVIRUS 2 (TAT 6-24 HRS): SARS Coronavirus 2: NEGATIVE

## 2020-07-13 NOTE — H&P (Signed)
Hannah Yang is a 50 y.o.  G:0 presents for hysterectomy because of abnormal uterine bleeding and symptomatic uterine fibroids. For many years the patient has endured the symptoms of fibroids and in 2006 underwent an abdominal myomectomy with transient relief from heavy bleeding and mass effects of the fibroids.  Approximately a year ago, however the patient began experiencing urinary urgency, frequency and heavy menstrual bleeding.  Over the past 6 months her bleeding has significantly increased, lasting up to 2 weeks with a pad change every 30 minutes to an hour.  She admits to cramping rated 5/10 however states she does not require analgesia.  She was given Lysteda and hormonal management for her bleeding however the results were suboptimal.  In March 2022 the patient was given Zoladex 3.6 mg as a means of curtailing her bleeding and fibroid symptoms.  At that time her pelvic ultrasound showed a uterine volume of 539 cc with #4 fibroids ranging from 3.8 cm to  7.78 cm.  Her TSH was 2.1 and hemoglobin 15.2..  Since receiving the Zoladex the patient reports amenorrhea, gradual decrease in urinary tract symptoms and a decrease in abdominal girth.  She denies any changes in bowel function, vaginitis symptoms and has been abstinent due to discomfort she relates to her fibroids.   The patient received #2 additional monthly  doses of Zoladex 3.6 mg and a repeat ultrasound in June revealed an anteverted uterus with volume of 282 cc (decrease from 539 cc): 7.4 x 7.4 x 9.8 cm, endometrium obscured by fibroids, # 6 fibroids: posterior lower uterine segment sub-serosal: 4.28 cm and 2.62 cm, intramural anterior-2.29 cm and 1.44 cm, anterior mid intramural-2.29 cm and posterior fundal sub-serosal-3.14 cm; right ovary not seen due to overlying bowel gas and left ovary-2.49 cm.  The patient was given  both medical and surgical management options for her fibroids and abnormal uterine bleeding but she has chosen to proceed with  definitive therapy in the form of hysterectomy.   Past Medical History  OB History:G: 0  GYN History: menarche: 50 YO;  LMP: 03/2020;    Contraception: Condoms/Abstinence; Has a remote history of abnormal PAP smear in 1996 treated with a LEEP, have been normal since. Last PAP smear: 2020 with negative HPV  Medical History: Diabetes Mellitus, Hypertension, Sleep Apnea, Thyroid Disease and Anxiety  Surgical History: 1996 Loop Electrosurgical Excision Procedure; 2005 Dilatation and Curettage and 2006 Abdominal Myomectomy Denies problems with anesthesia; history of blood transfusions in 2005.  Family History: unknown-patient is adopted  Social History: Married and employed as a Designer, jewellery; Denies tobacco or alcohol use   Medications: Escitalopram 5 mg daily Atenolol 50 mg daily Levothyroxine 25 mcg daily Meloxicam 15 mg daily Multivitamin daily Synjardy SR 25-1,000 mg ER daily Trajdenta 5 mg daily Valacyclovir 500 mg daily Vitamin B-12 1000 mcg/mL daily  Allergies  Allergen Reactions  . Shellfish Allergy     PER ALLERGY TEST CAN EAT WITHOUT PROBLEM    Denies sensitivity to peanuts, shellfish, soy, latex or adhesives.  The patient reports testing allergic to shellfish but she eats shellfish without a problem.   ROS: Admits to glasses/contact lenses, urinary urgency, frequency  Denies headache, vision changes, nasal congestion, dysphagia, tinnitus, dizziness, hoarseness, cough,  chest pain, shortness of breath, nausea, vomiting, diarrhea,constipation,   dysuria, hematuria, vaginitis symptoms, pelvic pain, swelling of joints,easy bruising,  myalgias, arthralgias, skin rashes, unexplained weight loss and except as is mentioned in the history of present illness, patient's review of systems is otherwise  negative.      Physical Exam  Bp: 136/80;  P: 74 bpm;  Temperature: 98.3 degrees F orally; R: 20;  Weight: 157 lbs.;  Height: 4' 10.5";  BMI: 32.3  Neck: supple without  masses or thyromegaly Lungs: clear to auscultation Heart: regular rate and rhythm Abdomen: soft, non-tender and no organomegaly Pelvic:EGBUS- wnl; vagina-normal rugae; uterus-(exam limited by habitus) 10-12 weeks l size, cervix without lesions or motion tenderness; adnexae-no tenderness or masses Extremities:  no clubbing, cyanosis or edema   Assesment: Abnormal Uterine Bleeding                      Symptomatic Uterine Fibroids   Disposition: The robot-assisted hysterectomy was reviewed with the patient along with the indication for her procedure. Benefits of the robotic approach include lesser postoperative pain, less blood loss during surgery, reduced risk of injury to other organs, due to better visualization with a 3-D HD 10 times magnifying camera; shorter hospital stay between 0-1 night and rapid recovery with return to daily routine in 2-3 weeks (however, nothing in the vagina for 6 weeks). Although the robot-assisted hysterectomy has a longer operative time than traditional laparotomy, the benefits usually outweigh the risks, in a patient with good medical history,   Risks include bleeding, infection, injury to other organs (bladder more vulnerable if previous cesarean delivery) , need for laparotomy, transient post-operative facial edema, increased risk of pelvic prolapse (associated with any hysterectomy) as well as earlier onset of menopause. Preservation of the ovaries was also reviewed and recommended. Finally, we recommended bilateral salpingectomy for lifelong reduction of ovarian cancer. A Miralax Bowel Prep was given to the patient to complete the day before her surgery.  The patient's questions were answered and she verbalized understanding of the risks and pre-operative instructions. The patient has consented to proceed with a Robot Assisted Laparoscopic Hysterectomy with Bilateral Salpingectomy at [facility] on July 15, 2020 at 7:30 a.m.  CSN# [id number]    Ebon Ketchum J. Florene Glen, PA-C  for Dr. Dede Query. Rivard

## 2020-07-15 ENCOUNTER — Ambulatory Visit (HOSPITAL_BASED_OUTPATIENT_CLINIC_OR_DEPARTMENT_OTHER): Payer: 59 | Admitting: Anesthesiology

## 2020-07-15 ENCOUNTER — Encounter (HOSPITAL_BASED_OUTPATIENT_CLINIC_OR_DEPARTMENT_OTHER): Payer: Self-pay | Admitting: Obstetrics and Gynecology

## 2020-07-15 ENCOUNTER — Encounter (HOSPITAL_BASED_OUTPATIENT_CLINIC_OR_DEPARTMENT_OTHER)
Admission: RE | Disposition: A | Discharge status: Home / Self Care | Payer: Self-pay | Source: Home / Self Care | Attending: Obstetrics and Gynecology

## 2020-07-15 ENCOUNTER — Other Ambulatory Visit: Payer: Self-pay

## 2020-07-15 ENCOUNTER — Ambulatory Visit (HOSPITAL_BASED_OUTPATIENT_CLINIC_OR_DEPARTMENT_OTHER)
Admission: RE | Admit: 2020-07-15 | Discharge: 2020-07-15 | Disposition: A | Discharge status: Home / Self Care | Payer: 59 | Attending: Obstetrics and Gynecology | Admitting: Obstetrics and Gynecology

## 2020-07-15 DIAGNOSIS — Z79899 Other long term (current) drug therapy: Secondary | ICD-10-CM | POA: Insufficient documentation

## 2020-07-15 DIAGNOSIS — D259 Leiomyoma of uterus, unspecified: Secondary | ICD-10-CM | POA: Diagnosis present

## 2020-07-15 DIAGNOSIS — E119 Type 2 diabetes mellitus without complications: Secondary | ICD-10-CM

## 2020-07-15 DIAGNOSIS — N921 Excessive and frequent menstruation with irregular cycle: Secondary | ICD-10-CM

## 2020-07-15 DIAGNOSIS — N736 Female pelvic peritoneal adhesions (postinfective): Secondary | ICD-10-CM | POA: Insufficient documentation

## 2020-07-15 DIAGNOSIS — D219 Benign neoplasm of connective and other soft tissue, unspecified: Secondary | ICD-10-CM | POA: Diagnosis present

## 2020-07-15 HISTORY — DX: Anxiety disorder, unspecified: F41.9

## 2020-07-15 HISTORY — DX: Frequency of micturition: R35.0

## 2020-07-15 HISTORY — DX: Hypothyroidism, unspecified: E03.9

## 2020-07-15 HISTORY — DX: Unspecified osteoarthritis, unspecified site: M19.90

## 2020-07-15 HISTORY — DX: Presence of spectacles and contact lenses: Z97.3

## 2020-07-15 HISTORY — PX: CYSTOSCOPY: SHX5120

## 2020-07-15 HISTORY — PX: ROBOTIC ASSISTED LAPAROSCOPIC HYSTERECTOMY AND SALPINGECTOMY: SHX6379

## 2020-07-15 HISTORY — DX: Type 2 diabetes mellitus without complications: E11.9

## 2020-07-15 LAB — GLUCOSE, CAPILLARY
Glucose-Capillary: 181 mg/dL — ABNORMAL HIGH (ref 70–99)
Glucose-Capillary: 221 mg/dL — ABNORMAL HIGH (ref 70–99)

## 2020-07-15 LAB — TYPE AND SCREEN
ABO/RH(D): A POS
Antibody Screen: NEGATIVE

## 2020-07-15 LAB — POCT PREGNANCY, URINE: Preg Test, Ur: NEGATIVE

## 2020-07-15 LAB — ABO/RH: ABO/RH(D): A POS

## 2020-07-15 SURGERY — XI ROBOTIC ASSISTED LAPAROSCOPIC HYSTERECTOMY AND SALPINGECTOMY
Anesthesia: General | Site: Urethra | Laterality: Bilateral

## 2020-07-15 MED ORDER — SUCCINYLCHOLINE CHLORIDE 200 MG/10ML IV SOSY
PREFILLED_SYRINGE | INTRAVENOUS | Status: AC
  Filled 2020-07-15: qty 10

## 2020-07-15 MED ORDER — EPHEDRINE SULFATE 50 MG/ML IJ SOLN
INTRAMUSCULAR | Status: DC | PRN
  Administered 2020-07-15: 20 mg via INTRAVENOUS
  Administered 2020-07-15: 10 mg via INTRAVENOUS
  Administered 2020-07-15: 20 mg via INTRAVENOUS
  Administered 2020-07-15: 10 mg via INTRAVENOUS
  Administered 2020-07-15: 20 mg via INTRAVENOUS
  Administered 2020-07-15: 5 mg via INTRAVENOUS

## 2020-07-15 MED ORDER — SODIUM CHLORIDE 0.9 % IR SOLN
Status: DC | PRN
  Administered 2020-07-15: 200 mL

## 2020-07-15 MED ORDER — FENTANYL CITRATE (PF) 100 MCG/2ML IJ SOLN: 25.0000 ug | INTRAMUSCULAR | Status: DC | PRN

## 2020-07-15 MED ORDER — ONDANSETRON HCL 4 MG/2ML IJ SOLN
INTRAMUSCULAR | Status: DC | PRN
  Administered 2020-07-15: 4 mg via INTRAVENOUS

## 2020-07-15 MED ORDER — ACETAMINOPHEN 500 MG PO TABS
ORAL_TABLET | ORAL | Status: AC
  Filled 2020-07-15: qty 2

## 2020-07-15 MED ORDER — POVIDONE-IODINE 10 % EX SWAB: 2.0000 "application " | Freq: Once | CUTANEOUS | Status: DC

## 2020-07-15 MED ORDER — KETOROLAC TROMETHAMINE 30 MG/ML IJ SOLN
30.0000 mg | Freq: Once | INTRAMUSCULAR | Status: AC
  Administered 2020-07-15: 30 mg via INTRAVENOUS

## 2020-07-15 MED ORDER — DOCUSATE SODIUM 100 MG PO CAPS
100.0000 mg | ORAL_CAPSULE | Freq: Two times a day (BID) | ORAL | Status: DC
  Administered 2020-07-15: 100 mg via ORAL

## 2020-07-15 MED ORDER — ENSURE PRE-SURGERY PO LIQD: 592.0000 mL | Freq: Once | ORAL | Status: DC

## 2020-07-15 MED ORDER — ACETAMINOPHEN 500 MG PO TABS: ORAL_TABLET | ORAL | 1 refills | Status: AC

## 2020-07-15 MED ORDER — CEFAZOLIN (ANCEF) 1 G IV SOLR: 1.0000 g | INTRAVENOUS | Status: DC

## 2020-07-15 MED ORDER — SIMETHICONE 80 MG PO CHEW
80.0000 mg | CHEWABLE_TABLET | Freq: Four times a day (QID) | ORAL | Status: DC | PRN
  Administered 2020-07-15: 80 mg via ORAL

## 2020-07-15 MED ORDER — CELECOXIB 200 MG PO CAPS
400.0000 mg | ORAL_CAPSULE | ORAL | Status: AC
  Administered 2020-07-15: 400 mg via ORAL

## 2020-07-15 MED ORDER — DOCUSATE SODIUM 100 MG PO CAPS
ORAL_CAPSULE | ORAL | Status: AC
  Filled 2020-07-15: qty 1

## 2020-07-15 MED ORDER — CELECOXIB 200 MG PO CAPS
ORAL_CAPSULE | ORAL | Status: AC
  Filled 2020-07-15: qty 2

## 2020-07-15 MED ORDER — ONDANSETRON HCL 4 MG PO TABS: 4.0000 mg | ORAL_TABLET | Freq: Four times a day (QID) | ORAL | Status: DC | PRN

## 2020-07-15 MED ORDER — FENTANYL CITRATE (PF) 100 MCG/2ML IJ SOLN
INTRAMUSCULAR | Status: AC
  Filled 2020-07-15: qty 2

## 2020-07-15 MED ORDER — SIMETHICONE 80 MG PO CHEW
CHEWABLE_TABLET | ORAL | Status: AC
  Filled 2020-07-15: qty 1

## 2020-07-15 MED ORDER — GABAPENTIN 300 MG PO CAPS
300.0000 mg | ORAL_CAPSULE | ORAL | Status: AC
  Administered 2020-07-15: 300 mg via ORAL

## 2020-07-15 MED ORDER — ROCURONIUM BROMIDE 100 MG/10ML IV SOLN
INTRAVENOUS | Status: DC | PRN
  Administered 2020-07-15: 20 mg via INTRAVENOUS
  Administered 2020-07-15: 60 mg via INTRAVENOUS
  Administered 2020-07-15 (×2): 20 mg via INTRAVENOUS

## 2020-07-15 MED ORDER — OXYCODONE HCL 5 MG PO TABS
5.0000 mg | ORAL_TABLET | ORAL | Status: DC | PRN
  Administered 2020-07-15: 5 mg via ORAL

## 2020-07-15 MED ORDER — SCOPOLAMINE 1 MG/3DAYS TD PT72
1.0000 | MEDICATED_PATCH | TRANSDERMAL | Status: DC
  Administered 2020-07-15: 1.5 mg via TRANSDERMAL

## 2020-07-15 MED ORDER — MENTHOL 3 MG MT LOZG: 1.0000 | LOZENGE | OROMUCOSAL | Status: DC | PRN

## 2020-07-15 MED ORDER — ROCURONIUM BROMIDE 10 MG/ML (PF) SYRINGE
PREFILLED_SYRINGE | INTRAVENOUS | Status: AC
  Filled 2020-07-15: qty 10

## 2020-07-15 MED ORDER — SODIUM CHLORIDE 0.9 % IV SOLN: 1.0000 g | Freq: Once | INTRAVENOUS | Status: DC

## 2020-07-15 MED ORDER — OXYCODONE HCL 5 MG PO TABS
ORAL_TABLET | ORAL | Status: AC
  Filled 2020-07-15: qty 1

## 2020-07-15 MED ORDER — OXYCODONE HCL 5 MG PO TABS: ORAL_TABLET | ORAL | 0 refills | Status: AC

## 2020-07-15 MED ORDER — ESTRADIOL 0.1 MG/GM VA CREA
TOPICAL_CREAM | VAGINAL | Status: DC | PRN
  Administered 2020-07-15: 1 via VAGINAL

## 2020-07-15 MED ORDER — ACETAMINOPHEN 500 MG PO TABS
1000.0000 mg | ORAL_TABLET | Freq: Four times a day (QID) | ORAL | Status: DC
  Administered 2020-07-15: 1000 mg via ORAL

## 2020-07-15 MED ORDER — FENTANYL CITRATE (PF) 100 MCG/2ML IJ SOLN
INTRAMUSCULAR | Status: DC | PRN
  Administered 2020-07-15: 25 ug via INTRAVENOUS
  Administered 2020-07-15: 50 ug via INTRAVENOUS
  Administered 2020-07-15: 100 ug via INTRAVENOUS
  Administered 2020-07-15: 25 ug via INTRAVENOUS
  Administered 2020-07-15 (×3): 50 ug via INTRAVENOUS

## 2020-07-15 MED ORDER — ENSURE PRE-SURGERY PO LIQD: 296.0000 mL | Freq: Once | ORAL | Status: DC

## 2020-07-15 MED ORDER — LACTATED RINGERS IV SOLN
INTRAVENOUS | Status: DC
  Administered 2020-07-15: 1000 mL via INTRAVENOUS

## 2020-07-15 MED ORDER — SODIUM CHLORIDE 0.9 % IV SOLN
INTRAVENOUS | Status: DC | PRN
  Administered 2020-07-15: 10 mL
  Administered 2020-07-15: 110 mL

## 2020-07-15 MED ORDER — CEFAZOLIN SODIUM-DEXTROSE 2-4 GM/100ML-% IV SOLN
INTRAVENOUS | Status: AC
  Filled 2020-07-15: qty 100

## 2020-07-15 MED ORDER — ONDANSETRON HCL 4 MG/2ML IJ SOLN
INTRAMUSCULAR | Status: AC
  Filled 2020-07-15: qty 2

## 2020-07-15 MED ORDER — LIDOCAINE HCL (PF) 2 % IJ SOLN
INTRAMUSCULAR | Status: AC
  Filled 2020-07-15: qty 5

## 2020-07-15 MED ORDER — KETOROLAC TROMETHAMINE 30 MG/ML IJ SOLN
INTRAMUSCULAR | Status: AC
  Filled 2020-07-15: qty 1

## 2020-07-15 MED ORDER — GABAPENTIN 300 MG PO CAPS
ORAL_CAPSULE | ORAL | Status: AC
  Filled 2020-07-15: qty 1

## 2020-07-15 MED ORDER — LACTATED RINGERS IV SOLN: INTRAVENOUS | Status: DC

## 2020-07-15 MED ORDER — IBUPROFEN 600 MG PO TABS: ORAL_TABLET | ORAL | 1 refills | Status: AC

## 2020-07-15 MED ORDER — MIDAZOLAM HCL 2 MG/2ML IJ SOLN
INTRAMUSCULAR | Status: AC
  Filled 2020-07-15: qty 2

## 2020-07-15 MED ORDER — EPHEDRINE 5 MG/ML INJ
INTRAVENOUS | Status: AC
  Filled 2020-07-15: qty 10

## 2020-07-15 MED ORDER — SCOPOLAMINE 1 MG/3DAYS TD PT72
MEDICATED_PATCH | TRANSDERMAL | Status: AC
  Filled 2020-07-15: qty 1

## 2020-07-15 MED ORDER — DEXAMETHASONE SODIUM PHOSPHATE 4 MG/ML IJ SOLN
INTRAMUSCULAR | Status: DC | PRN
  Administered 2020-07-15: 10 mg via INTRAVENOUS

## 2020-07-15 MED ORDER — DEXAMETHASONE SODIUM PHOSPHATE 10 MG/ML IJ SOLN
INTRAMUSCULAR | Status: AC
  Filled 2020-07-15: qty 1

## 2020-07-15 MED ORDER — ACETAMINOPHEN 500 MG PO TABS
1000.0000 mg | ORAL_TABLET | ORAL | Status: AC
  Administered 2020-07-15: 1000 mg via ORAL

## 2020-07-15 MED ORDER — SUGAMMADEX SODIUM 200 MG/2ML IV SOLN
INTRAVENOUS | Status: DC | PRN
  Administered 2020-07-15: 200 mg via INTRAVENOUS

## 2020-07-15 MED ORDER — CEFAZOLIN SODIUM-DEXTROSE 2-3 GM-%(50ML) IV SOLR
INTRAVENOUS | Status: DC | PRN
  Administered 2020-07-15: 2 g via INTRAVENOUS

## 2020-07-15 MED ORDER — PROPOFOL 10 MG/ML IV BOLUS
INTRAVENOUS | Status: AC
  Filled 2020-07-15: qty 40

## 2020-07-15 MED ORDER — FENTANYL CITRATE (PF) 250 MCG/5ML IJ SOLN
INTRAMUSCULAR | Status: AC
  Filled 2020-07-15: qty 5

## 2020-07-15 MED ORDER — LIDOCAINE HCL (CARDIAC) PF 100 MG/5ML IV SOSY
PREFILLED_SYRINGE | INTRAVENOUS | Status: DC | PRN
  Administered 2020-07-15: 60 mg via INTRAVENOUS

## 2020-07-15 MED ORDER — HYDROMORPHONE HCL 1 MG/ML IJ SOLN: 1.0000 mg | INTRAMUSCULAR | Status: DC | PRN

## 2020-07-15 MED ORDER — MIDAZOLAM HCL 5 MG/5ML IJ SOLN
INTRAMUSCULAR | Status: DC | PRN
  Administered 2020-07-15: 2 mg via INTRAVENOUS

## 2020-07-15 MED ORDER — ONDANSETRON HCL 4 MG/2ML IJ SOLN: 4.0000 mg | Freq: Four times a day (QID) | INTRAMUSCULAR | Status: DC | PRN

## 2020-07-15 MED ORDER — PROPOFOL 10 MG/ML IV BOLUS
INTRAVENOUS | Status: DC | PRN
  Administered 2020-07-15: 150 mg via INTRAVENOUS

## 2020-07-15 MED ORDER — SUCCINYLCHOLINE CHLORIDE 20 MG/ML IJ SOLN
INTRAMUSCULAR | Status: DC | PRN
  Administered 2020-07-15: 120 mg via INTRAVENOUS

## 2020-07-15 SURGICAL SUPPLY — 74 items
ADH SKN CLS APL DERMABOND .7 (GAUZE/BANDAGES/DRESSINGS) ×2
BAG DECANTER FOR FLEXI CONT (MISCELLANEOUS) ×4 IMPLANT
BARRIER ADHS 3X4 INTERCEED (GAUZE/BANDAGES/DRESSINGS) ×4 IMPLANT
BLADE SURG 10 STRL SS (BLADE) ×4 IMPLANT
BRR ADH 4X3 ABS CNTRL BYND (GAUZE/BANDAGES/DRESSINGS) ×2
CATH FOLEY 3WAY  5CC 16FR (CATHETERS) ×2
CATH FOLEY 3WAY 5CC 16FR (CATHETERS) ×2 IMPLANT
CLOSURE WOUND 1/4X4 (GAUZE/BANDAGES/DRESSINGS) ×1
COVER BACK TABLE 60X90IN (DRAPES) ×4 IMPLANT
COVER TIP SHEARS 8 DVNC (MISCELLANEOUS) ×2 IMPLANT
COVER TIP SHEARS 8MM DA VINCI (MISCELLANEOUS) ×2
COVER WAND RF STERILE (DRAPES) ×4 IMPLANT
DEFOGGER SCOPE WARMER CLEARIFY (MISCELLANEOUS) ×4 IMPLANT
DERMABOND ADVANCED (GAUZE/BANDAGES/DRESSINGS) ×2
DERMABOND ADVANCED .7 DNX12 (GAUZE/BANDAGES/DRESSINGS) ×2 IMPLANT
DRAPE ARM DVNC X/XI (DISPOSABLE) ×8 IMPLANT
DRAPE COLUMN DVNC XI (DISPOSABLE) ×2 IMPLANT
DRAPE DA VINCI XI ARM (DISPOSABLE) ×8
DRAPE DA VINCI XI COLUMN (DISPOSABLE) ×2
DRAPE UTILITY XL STRL (DRAPES) ×4 IMPLANT
DURAPREP 26ML APPLICATOR (WOUND CARE) ×4 IMPLANT
ELECT REM PT RETURN 9FT ADLT (ELECTROSURGICAL) ×4
ELECTRODE REM PT RTRN 9FT ADLT (ELECTROSURGICAL) ×2 IMPLANT
GAUZE 4X4 16PLY RFD (DISPOSABLE) ×4 IMPLANT
GLOVE SURG ENC MOIS LTX SZ6.5 (GLOVE) ×16 IMPLANT
GLOVE SURG ENC MOIS LTX SZ7 (GLOVE) ×4 IMPLANT
GLOVE SURG LTX SZ6.5 (GLOVE) ×12 IMPLANT
GLOVE SURG POLYISO LF SZ6.5 (GLOVE) ×8 IMPLANT
GLOVE SURG UNDER POLY LF SZ6.5 (GLOVE) ×8 IMPLANT
GLOVE SURG UNDER POLY LF SZ7 (GLOVE) ×28 IMPLANT
GOWN STRL REUS W/TWL LRG LVL3 (GOWN DISPOSABLE) ×4 IMPLANT
HIBICLENS CHG 4% 4OZ (MISCELLANEOUS) ×4 IMPLANT
HOLDER FOLEY CATH W/STRAP (MISCELLANEOUS) IMPLANT
IRRIG SUCT STRYKERFLOW 2 WTIP (MISCELLANEOUS) ×4
IRRIGATION SUCT STRKRFLW 2 WTP (MISCELLANEOUS) ×2 IMPLANT
IV NS 1000ML (IV SOLUTION) ×8
IV NS 1000ML BAXH (IV SOLUTION) ×4 IMPLANT
KIT TURNOVER CYSTO (KITS) ×4 IMPLANT
LEGGING LITHOTOMY PAIR STRL (DRAPES) ×4 IMPLANT
Light Handle ×4 IMPLANT
MANIFOLD NEPTUNE II (INSTRUMENTS) ×4 IMPLANT
NEEDLE HYPO 22GX1.5 SAFETY (NEEDLE) ×4 IMPLANT
OBTURATOR OPTICAL STANDARD 8MM (TROCAR) ×2
OBTURATOR OPTICAL STND 8 DVNC (TROCAR) ×2
OBTURATOR OPTICALSTD 8 DVNC (TROCAR) ×2 IMPLANT
OCCLUDER COLPOPNEUMO (BALLOONS) ×4 IMPLANT
PACK ROBOT WH (CUSTOM PROCEDURE TRAY) ×4 IMPLANT
PACK ROBOTIC GOWN (GOWN DISPOSABLE) ×4 IMPLANT
PACK TRENDGUARD 450 HYBRID PRO (MISCELLANEOUS) ×2 IMPLANT
PAD OB MATERNITY 4.3X12.25 (PERSONAL CARE ITEMS) ×8 IMPLANT
PAD PREP 24X48 CUFFED NSTRL (MISCELLANEOUS) ×4 IMPLANT
POUCH LAPAROSCOPIC INSTRUMENT (MISCELLANEOUS) ×4 IMPLANT
PROTECTOR NERVE ULNAR (MISCELLANEOUS) ×12 IMPLANT
SEAL CANN UNIV 5-8 DVNC XI (MISCELLANEOUS) ×8 IMPLANT
SEAL XI 5MM-8MM UNIVERSAL (MISCELLANEOUS) ×8
SEALER VESSEL DA VINCI XI (MISCELLANEOUS) ×2
SEALER VESSEL EXT DVNC XI (MISCELLANEOUS) ×2 IMPLANT
SET IRRIG Y TYPE TUR BLADDER L (SET/KITS/TRAYS/PACK) ×4 IMPLANT
SET TRI-LUMEN FLTR TB AIRSEAL (TUBING) ×4 IMPLANT
SOL PREP POV-IOD 4OZ 10% (MISCELLANEOUS) ×4 IMPLANT
SOL PREP PROV IODINE SCRUB 4OZ (MISCELLANEOUS) ×4 IMPLANT
STRIP CLOSURE SKIN 1/4X4 (GAUZE/BANDAGES/DRESSINGS) ×3 IMPLANT
SUT MNCRL AB 3-0 PS2 27 (SUTURE) ×8 IMPLANT
SUT VIC AB 0 CT1 27 (SUTURE) ×8
SUT VIC AB 0 CT1 27XBRD ANBCTR (SUTURE) ×4 IMPLANT
SUT VIC AB 2-0 UR6 27 (SUTURE) ×8 IMPLANT
SUT VICRYL 0 UR6 27IN ABS (SUTURE) ×4 IMPLANT
SUT VLOC 180 0 9IN  GS21 (SUTURE) ×4
SUT VLOC 180 0 9IN GS21 (SUTURE) ×4 IMPLANT
TIP UTERINE 6.7X8CM BLUE DISP (MISCELLANEOUS) ×4 IMPLANT
TOWEL OR 17X26 10 PK STRL BLUE (TOWEL DISPOSABLE) ×4 IMPLANT
TRENDGUARD 450 HYBRID PRO PACK (MISCELLANEOUS) ×4
TROCAR PORT AIRSEAL 8X120 (TROCAR) ×4 IMPLANT
WATER STERILE IRR 1000ML POUR (IV SOLUTION) ×4 IMPLANT

## 2020-07-15 NOTE — Interval H&P Note (Signed)
History and Physical Interval Note:  07/15/2020 7:29 AM  Britne Wunder  has presented today for surgery, with the diagnosis of FIBROIDS.  The various methods of treatment have been discussed with the patient and family. After consideration of risks, benefits and other options for treatment, the patient has consented to  Procedure(s) with comments: XI ROBOTIC ASSISTED LAPAROSCOPIC HYSTERECTOMY AND SALPINGECTOMY (Bilateral) - REQUESTING 3.5 HOURS as a surgical intervention.  The patient's history has been reviewed, patient examined, no change in status, stable for surgery.  I have reviewed the patient's chart and labs.  Questions were answered to the patient's satisfaction.     Katharine Look A Wash Nienhaus

## 2020-07-15 NOTE — Anesthesia Preprocedure Evaluation (Addendum)
Anesthesia Evaluation  Patient identified by MRN, date of birth, ID band Patient awake    Reviewed: Allergy & Precautions, NPO status , Patient's Chart, lab work & pertinent test results  Airway Mallampati: II  TM Distance: >3 FB     Dental   Pulmonary sleep apnea , former smoker,    breath sounds clear to auscultation       Cardiovascular hypertension,  Rhythm:Regular Rate:Normal     Neuro/Psych Anxiety    GI/Hepatic negative GI ROS, Neg liver ROS,   Endo/Other  diabetesHypothyroidism   Renal/GU      Musculoskeletal   Abdominal   Peds  Hematology   Anesthesia Other Findings   Reproductive/Obstetrics                             Anesthesia Physical Anesthesia Plan  ASA: 3  Anesthesia Plan: General   Post-op Pain Management:    Induction: Intravenous  PONV Risk Score and Plan: 3 and Ondansetron, Dexamethasone and Midazolam  Airway Management Planned: Oral ETT  Additional Equipment:   Intra-op Plan:   Post-operative Plan: Extubation in OR  Informed Consent: I have reviewed the patients History and Physical, chart, labs and discussed the procedure including the risks, benefits and alternatives for the proposed anesthesia with the patient or authorized representative who has indicated his/her understanding and acceptance.     Dental advisory given  Plan Discussed with: CRNA and Anesthesiologist  Anesthesia Plan Comments:         Anesthesia Quick Evaluation

## 2020-07-15 NOTE — Transfer of Care (Signed)
Immediate Anesthesia Transfer of Care Note  Patient: Hannah Yang  Procedure(s) Performed: Procedure(s) (LRB): XI ROBOTIC ASSISTED LAPAROSCOPIC HYSTERECTOMY AND SALPINGECTOMY (Bilateral) CYSTOSCOPY  Patient Location: PACU  Anesthesia Type: General  Level of Consciousness: awake, sedated, patient cooperative and responds to stimulation  Airway & Oxygen Therapy: Patient Spontanous Breathing and Patient connected to [place] 02 and soft FM   Post-op Assessment: Report given to PACU RN, Post -op Vital signs reviewed and stable and Patient moving all extremities  Post vital signs: Reviewed and stable  Complications: No apparent anesthesia complications

## 2020-07-15 NOTE — Anesthesia Postprocedure Evaluation (Signed)
Anesthesia Post Note  Patient: Hannah Yang  Procedure(s) Performed: XI ROBOTIC ASSISTED LAPAROSCOPIC HYSTERECTOMY AND SALPINGECTOMY (Bilateral: Abdomen) CYSTOSCOPY (Urethra)     Patient location during evaluation: PACU Anesthesia Type: General Level of consciousness: awake Pain management: pain level controlled Vital Signs Assessment: post-procedure vital signs reviewed and stable Respiratory status: spontaneous breathing Cardiovascular status: stable Postop Assessment: no apparent nausea or vomiting Anesthetic complications: no   No notable events documented.  Last Vitals:  Vitals:   07/15/20 1330 07/15/20 1400  BP: 134/85 136/84  Pulse: 96 96  Resp: 16 15  Temp: 36.8 C 36.8 C  SpO2: 96% 96%    Last Pain:  Vitals:   07/15/20 1330  TempSrc:   PainSc: 2                  Valla Pacey

## 2020-07-15 NOTE — Progress Notes (Signed)
RN spoke with Dr Cletis Media patient has completed all goals and she will be discharged home.

## 2020-07-15 NOTE — Anesthesia Procedure Notes (Signed)
Procedure Name: Intubation Date/Time: 07/15/2020 7:48 AM Performed by: Justice Rocher, CRNA Pre-anesthesia Checklist: Patient identified, Emergency Drugs available, Suction available, Patient being monitored and Timeout performed Patient Re-evaluated:Patient Re-evaluated prior to induction Oxygen Delivery Method: Circle system utilized Preoxygenation: Pre-oxygenation with 100% oxygen Induction Type: IV induction Ventilation: Mask ventilation without difficulty Laryngoscope Size: Mac and 3 Grade View: Grade II Tube type: Oral Tube size: 7.0 mm Number of attempts: 1 Airway Equipment and Method: Stylet and Oral airway Placement Confirmation: ETT inserted through vocal cords under direct vision, positive ETCO2, breath sounds checked- equal and bilateral and CO2 detector Secured at: 24 cm Tube secured with: Tape Dental Injury: Teeth and Oropharynx as per pre-operative assessment  Comments: PreOp pt explained crack tear along midline corner of right side mouth. Sore c/o from pt . Assessed with RN

## 2020-07-15 NOTE — Discharge Instructions (Addendum)
Call [facility] @ [phone] if:  You have a temperature greater than or equal to 100.4 degrees Farenheit orally You have pain that is not made better by the pain medication given and taken as directed You have excessive bleeding or problems urinating  Take Colace (Docusate Sodium/Stool Softener) 100 mg 2-3 times daily while taking narcotic pain medicine to avoid constipation or until bowel movements are regular. Take, with food, every 6 hours (first dose 7 pm day of surgery):  Ibuprofen 600 mg and Acetaminophen 500 mg (#2 tablets);   Do not take Meloxicam while taking Ibuprofen  Use the Estradiol Vaginal Cream (apply with finger outside the vagina) 3 times a day for the next 2 weeks then as needed  You may drive after 2 weeks You may walk up steps  You may shower tomorrow You may resume a regular modified carbohydrate diet Keep incisions clean and dry Do not lift over 15 pounds for 6 weeks Avoid anything in vagina for 6 weeks (or until after your post-operative visit)    Post Anesthesia Home Care Instructions  Activity: Get plenty of rest for the remainder of the day. A responsible individual must stay with you for 24 hours following the procedure.  For the next 24 hours, DO NOT: -Drive a car -Paediatric nurse -Drink alcoholic beverages -Take any medication unless instructed by your physician -Make any legal decisions or sign important papers.  Meals: Start with liquid foods such as gelatin or soup. Progress to regular foods as tolerated. Avoid greasy, spicy, heavy foods. If nausea and/or vomiting occur, drink only clear liquids until the nausea and/or vomiting subsides. Call your physician if vomiting continues.  Special Instructions/Symptoms: Your throat may feel dry or sore from the anesthesia or the breathing tube placed in your throat during surgery. If this causes discomfort, gargle with warm salt water. The discomfort should disappear within 24  hours.  If you had a scopolamine patch placed behind your ear for the management of post- operative nausea and/or vomiting:  1. The medication in the patch is effective for 72 hours, after which it should be removed.  Wrap patch in a tissue and discard in the trash. Wash hands thoroughly with soap and water. 2. You may remove the patch earlier than 72 hours if you experience unpleasant side effects which may include dry mouth, dizziness or visual disturbances. 3. Avoid touching the patch. Wash your hands with soap and water after contact with the patch.

## 2020-07-15 NOTE — Op Note (Signed)
Preoperative diagnosis: uterine fibroids  Postoperative diagnosis: Same with pelvic adhesions  Anesthesia: General  Anesthesiologist: Dr. Nyoka Cowden  Procedure: Robotically assisted total hysterectomy with bilateral salpingectomy  Surgeon: Dr. Katharine Look Jasun Gasparini  Assistant: Earnstine Regal P.A.-C.  Estimated blood loss: 50 cc  Procedure:  After being informed of the planned procedure with possible complications including but not limited to bleeding, infection, injury to other organs, need for laparotomy, expected hospital stay and recovery, informed consent is obtained and patient is taken to or #5. She is placed in  lithotomy position on [place] with both arms padded and tucked on each side and bilateral knee-high sequential compressive devices. She is given general anesthesia with endotracheal intubation without any complication. She is prepped and draped in a sterile fashion. A three-way Foley catheter is inserted in her bladder.  Pelvic exam reveals: 12 week size uterus with normal adnexa  A weighted speculum is inserted in the vagina and the anterior lip of the cervix is grasped with a tenaculum forcep. We proceed with a paracervical block and vaginal infiltration using ropivacaine 0.5% diluted 1 in 1 with saline. The uterus was then sounded at 8 cm. We easily dilate the cervix using Hegar dilator to  #27 which allows for easy placement of the intrauterine RUMI manipulator with a 3.0 KOH ring and a vaginal occluder. The ring is sutured to the cervix with 0 Vicryl.  Trocar placement is decided. We infiltrate 2 cm above the umbilicus with 10 cc of ropivacaine per protocol and perform a 10 mm vertical incision which is brought down bluntly to the fascia. The fascia is identified and grasped with Coker forceps. The fascia is incised with Mayo scissors. Peritoneum is entered bluntly. A pursestring suture of 0 Vicryl is placed on the fascia and a 10 mm Hassan trocar is easily inserted in the abdominal  cavity held in placed with a Purstring suture. This allows for easy insufflation of a pneumoperitoneum using warmed CO2 at a maximum pressure of 15 mm of mercury. 60 cc of Ropivacaine 0.5 % diluted 1 in 1 is sent in the pelvis and the patient is positioned in reverse Trendelenburg. We then placed two 84m robotic trocar on the left, one 858mrobotic trocar on the right and one one mm patient's side assistant trocar on the right  after infiltrating every site  with ropivacaine per protocol. The robot is docked on the right of the patient after positioning her in Trendelenburg. A monopolar scissors is inserted in arm #4, a Long bipolar forceps is inserted in arm #2 and a Progras is inserted in arm #1.  Preparation and docking is completed in 43 minutes.  Observation: We note grade 1 and 2 adhesions between the posterior aspect of the uterus and the bowels.  Grade 1 and 2 adhesions between the sigmoid colon and the left pelvic wall.  Grade 1 adhesions between the cecum and the right pelvic wall.  The uterus is enlarged to 12 weeks size with multiple fibroids.  Both tubes and both ovaries are normal.  Both anterior and posterior cul-de-sac are normal.  The appendix is not seen.  The liver is normal.  We proceeded with sharp dissection of the previously identified additions in order to easily retract the bowels in the upper abdominal cavity.  Monopolar scissors is then modified with a vessel sealer.  We start on the right side by sealing and cutting the mesosalpynx , the right utero-ovarian ligament and the right round ligament .  This gives [place]  entry into the retroperitoneal space with an easy dissection of the anterior broad ligament. The ligament was opened all the way to the left round ligament.   Moving to the left side we Seal and cut  the left round ligament , the left utero-ovarian ligament and  the mesosalpinx in between. Entry into the retroperitoneal space allows [place] to complete dissection of the bladder  on the left side and skeletonized the uterine vessels. The left broad ligament is then dissected all the way to the posterior KOH ring after identifying the full course of the left ureter.  We then proceed with systematic dissection of the bladder from the anterior vaginal cuff which is easily identified with the KOH ring. The plane of dissection is confirmed with filling the bladder with 200 cc of saline. We are able to dissect the bladder 2 cm below the KOH ring. We then opened the posterior right broad ligament all the way to the posterior KOH ring after identifying the full course of the right ureter.   Both tubes are removed from the pelvic cavity through the assistant's trocar.   With pressure on the KOH ring and the bladder fully dissected, we cauterize with long  bipolar and cut the uterine vessels on both sides at the level of the KOH ring.  The vaginal occluder is inflated and we proceed with a 360 colpotomy using an open monopolar scissors and freeing the uterus entirely.  The uterus is delivered vaginally with vaginal morcellation which required 22 minutes. The vaginal occluder is reinserted in the vagina to maintain pneumoperitoneum. The 1 cm peritoneal fibroid on the right pelvic wall is sharply excise and removed from the pelvic cavity.  Instruments are then modified for a suture cut in arm #4 and a long tip forcep in arm #2. We proceed with closure of the vaginal cuff with 2 running sutures of 0 V-Lock. We irrigated profusely with warm saline and confirm a satisfactory hemostasis as well as 2 normal ureters with good mobility and no dilatation.  A sheet of Interceed , divided in 2, is placed on the vaginal cuff and behind the ovaries.  All instruments are then removed and the robot is undocked. Console time: 2 hours and 30 minutes.  All trochars are removed under direct visualization after evacuating the pneumoperitoneum.  The fascia of the supraumbilical incision is closed with  the previously placed pursestring suture of 0 Vicryl. All incisions are then closed with subcuticular suture of 3-0 Monocryl and Dermabond.  A speculum is inserted in the vagina to confirm a adequate closure of the vaginal cuff and good hemostasis.  Proceed with postoperative cystoscopy: This reveals a intact bladder with 2 normal ureteral jets.  A small posterior wall benign-appearing polyp is noted in the bladder.  We will further consult with urology. A superficial right labial laceration is repaired with interrupted sutures of 3-0 Monocryl.  A superficial laceration of the vestibule is repaired with interrupted sutures of 3-0 Monocryl.  Estrace cream is applied to the vulva.  Instrument and sponge count is complete x2. The procedure is well tolerated by the patient is taken to recovery room in a well and stable condition.  Dr Cletis Media was present and scrubbed at all times. Surgical assistance was required due to the complexity of the anatomy and the robotic approach of the procedure.   Estimated blood loss: 50  Specimen: Uterus and tubes weighing 325 g sent to pathology

## 2020-07-16 ENCOUNTER — Encounter (HOSPITAL_BASED_OUTPATIENT_CLINIC_OR_DEPARTMENT_OTHER): Payer: Self-pay | Admitting: Obstetrics and Gynecology

## 2020-07-16 LAB — HEMOGLOBIN A1C
Hgb A1c MFr Bld: 7.9 % — ABNORMAL HIGH (ref 4.8–5.6)
Mean Plasma Glucose: 180 mg/dL

## 2020-07-19 LAB — SURGICAL PATHOLOGY

## 2022-05-29 ENCOUNTER — Other Ambulatory Visit: Payer: Self-pay | Admitting: Endocrinology

## 2022-05-29 DIAGNOSIS — E1165 Type 2 diabetes mellitus with hyperglycemia: Secondary | ICD-10-CM

## 2022-06-14 ENCOUNTER — Encounter (HOSPITAL_BASED_OUTPATIENT_CLINIC_OR_DEPARTMENT_OTHER): Payer: Self-pay

## 2022-06-14 ENCOUNTER — Inpatient Hospital Stay (HOSPITAL_BASED_OUTPATIENT_CLINIC_OR_DEPARTMENT_OTHER): Admission: RE | Admit: 2022-06-14 | Payer: 59 | Source: Ambulatory Visit

## 2022-06-30 ENCOUNTER — Ambulatory Visit (HOSPITAL_BASED_OUTPATIENT_CLINIC_OR_DEPARTMENT_OTHER)
Admission: RE | Admit: 2022-06-30 | Discharge: 2022-06-30 | Disposition: A | Discharge status: Home / Self Care | Payer: 59 | Source: Ambulatory Visit | Attending: Endocrinology | Admitting: Endocrinology

## 2022-06-30 ENCOUNTER — Encounter (HOSPITAL_BASED_OUTPATIENT_CLINIC_OR_DEPARTMENT_OTHER): Payer: Self-pay | Admitting: Radiology

## 2022-06-30 DIAGNOSIS — E1165 Type 2 diabetes mellitus with hyperglycemia: Secondary | ICD-10-CM | POA: Insufficient documentation

## 2022-10-20 ENCOUNTER — Other Ambulatory Visit: Payer: Self-pay | Admitting: Specialist

## 2022-10-20 DIAGNOSIS — R519 Headache, unspecified: Secondary | ICD-10-CM

## 2022-11-18 ENCOUNTER — Ambulatory Visit
Admission: RE | Admit: 2022-11-18 | Discharge: 2022-11-18 | Disposition: A | Discharge status: Home / Self Care | Payer: 59 | Source: Ambulatory Visit | Attending: Specialist | Admitting: Specialist

## 2022-11-18 DIAGNOSIS — R519 Headache, unspecified: Secondary | ICD-10-CM

## 2023-10-29 ENCOUNTER — Encounter: Payer: Self-pay | Admitting: Obstetrics and Gynecology

## 2023-10-29 ENCOUNTER — Other Ambulatory Visit: Payer: Self-pay | Admitting: Obstetrics and Gynecology

## 2023-10-29 DIAGNOSIS — Z1231 Encounter for screening mammogram for malignant neoplasm of breast: Secondary | ICD-10-CM

## 2023-12-21 ENCOUNTER — Ambulatory Visit
Admission: RE | Admit: 2023-12-21 | Discharge: 2023-12-21 | Disposition: A | Discharge status: Home / Self Care | Source: Ambulatory Visit | Attending: Obstetrics and Gynecology | Admitting: Obstetrics and Gynecology

## 2023-12-21 DIAGNOSIS — Z1231 Encounter for screening mammogram for malignant neoplasm of breast: Secondary | ICD-10-CM

## 2023-12-28 ENCOUNTER — Other Ambulatory Visit: Payer: Self-pay | Admitting: Obstetrics and Gynecology

## 2023-12-28 DIAGNOSIS — R928 Other abnormal and inconclusive findings on diagnostic imaging of breast: Secondary | ICD-10-CM

## 2024-01-10 ENCOUNTER — Ambulatory Visit
Admission: RE | Admit: 2024-01-10 | Discharge: 2024-01-10 | Disposition: A | Source: Ambulatory Visit | Attending: Obstetrics and Gynecology | Admitting: Obstetrics and Gynecology

## 2024-01-10 ENCOUNTER — Ambulatory Visit

## 2024-01-10 DIAGNOSIS — R928 Other abnormal and inconclusive findings on diagnostic imaging of breast: Secondary | ICD-10-CM
